# Patient Record
Sex: Male | Born: 1940 | Race: Black or African American | Hispanic: No | Marital: Married | Smoking: Former smoker
Health system: Southern US, Community
[De-identification: ages and names within clinical notes are randomized; demographics above are authoritative.]

## PROBLEM LIST (undated history)

## (undated) DIAGNOSIS — C9000 Multiple myeloma not having achieved remission: Secondary | ICD-10-CM

## (undated) DIAGNOSIS — K219 Gastro-esophageal reflux disease without esophagitis: Secondary | ICD-10-CM

## (undated) DIAGNOSIS — R0600 Dyspnea, unspecified: Secondary | ICD-10-CM

## (undated) DIAGNOSIS — E119 Type 2 diabetes mellitus without complications: Secondary | ICD-10-CM

## (undated) HISTORY — PX: EYE SURGERY: SHX253

## (undated) HISTORY — PX: CHOLECYSTECTOMY: SHX55

## (undated) HISTORY — PX: APPENDECTOMY: SHX54

## (undated) HISTORY — PX: HERNIA REPAIR: SHX51

---

## 2005-01-28 ENCOUNTER — Other Ambulatory Visit: Payer: Self-pay

## 2005-02-01 ENCOUNTER — Ambulatory Visit: Payer: Self-pay | Admitting: Specialist

## 2006-02-07 ENCOUNTER — Ambulatory Visit: Payer: Self-pay | Admitting: Gastroenterology

## 2018-05-26 DEATH — deceased

## 2019-07-10 ENCOUNTER — Inpatient Hospital Stay: Payer: Medicare Other | Admitting: Registered Nurse

## 2019-07-10 ENCOUNTER — Encounter
Admission: EM | Disposition: A | Discharge status: Home / Self Care | Payer: Self-pay | Source: Home / Self Care | Attending: Internal Medicine

## 2019-07-10 ENCOUNTER — Inpatient Hospital Stay: Payer: Medicare Other

## 2019-07-10 ENCOUNTER — Other Ambulatory Visit: Payer: Self-pay

## 2019-07-10 ENCOUNTER — Emergency Department: Payer: Medicare Other

## 2019-07-10 ENCOUNTER — Inpatient Hospital Stay
Admission: EM | Admit: 2019-07-10 | Discharge: 2019-07-12 | DRG: 116 | Disposition: A | Discharge status: Home / Self Care | Payer: Medicare Other | Attending: Internal Medicine | Admitting: Internal Medicine

## 2019-07-10 DIAGNOSIS — Z20828 Contact with and (suspected) exposure to other viral communicable diseases: Secondary | ICD-10-CM | POA: Diagnosis present

## 2019-07-10 DIAGNOSIS — E876 Hypokalemia: Secondary | ICD-10-CM | POA: Diagnosis present

## 2019-07-10 DIAGNOSIS — Z79899 Other long term (current) drug therapy: Secondary | ICD-10-CM | POA: Diagnosis not present

## 2019-07-10 DIAGNOSIS — Z7189 Other specified counseling: Secondary | ICD-10-CM | POA: Diagnosis not present

## 2019-07-10 DIAGNOSIS — S0522XA Ocular laceration and rupture with prolapse or loss of intraocular tissue, left eye, initial encounter: Principal | ICD-10-CM | POA: Diagnosis present

## 2019-07-10 DIAGNOSIS — Z9049 Acquired absence of other specified parts of digestive tract: Secondary | ICD-10-CM | POA: Diagnosis not present

## 2019-07-10 DIAGNOSIS — Z888 Allergy status to other drugs, medicaments and biological substances status: Secondary | ICD-10-CM

## 2019-07-10 DIAGNOSIS — R0602 Shortness of breath: Secondary | ICD-10-CM

## 2019-07-10 DIAGNOSIS — Z66 Do not resuscitate: Secondary | ICD-10-CM | POA: Diagnosis not present

## 2019-07-10 DIAGNOSIS — I739 Peripheral vascular disease, unspecified: Secondary | ICD-10-CM | POA: Diagnosis present

## 2019-07-10 DIAGNOSIS — H538 Other visual disturbances: Secondary | ICD-10-CM | POA: Diagnosis present

## 2019-07-10 DIAGNOSIS — K219 Gastro-esophageal reflux disease without esophagitis: Secondary | ICD-10-CM | POA: Diagnosis present

## 2019-07-10 DIAGNOSIS — I10 Essential (primary) hypertension: Secondary | ICD-10-CM | POA: Diagnosis present

## 2019-07-10 DIAGNOSIS — Z23 Encounter for immunization: Secondary | ICD-10-CM

## 2019-07-10 DIAGNOSIS — C9000 Multiple myeloma not having achieved remission: Secondary | ICD-10-CM | POA: Diagnosis present

## 2019-07-10 DIAGNOSIS — J9819 Other pulmonary collapse: Secondary | ICD-10-CM | POA: Diagnosis not present

## 2019-07-10 DIAGNOSIS — J90 Pleural effusion, not elsewhere classified: Secondary | ICD-10-CM | POA: Diagnosis not present

## 2019-07-10 DIAGNOSIS — Z515 Encounter for palliative care: Secondary | ICD-10-CM | POA: Diagnosis not present

## 2019-07-10 DIAGNOSIS — Z809 Family history of malignant neoplasm, unspecified: Secondary | ICD-10-CM

## 2019-07-10 DIAGNOSIS — Z91018 Allergy to other foods: Secondary | ICD-10-CM

## 2019-07-10 DIAGNOSIS — S0181XA Laceration without foreign body of other part of head, initial encounter: Secondary | ICD-10-CM | POA: Diagnosis present

## 2019-07-10 DIAGNOSIS — H4010X0 Unspecified open-angle glaucoma, stage unspecified: Secondary | ICD-10-CM | POA: Diagnosis present

## 2019-07-10 DIAGNOSIS — Z9889 Other specified postprocedural states: Secondary | ICD-10-CM

## 2019-07-10 DIAGNOSIS — S0590XA Unspecified injury of unspecified eye and orbit, initial encounter: Secondary | ICD-10-CM

## 2019-07-10 DIAGNOSIS — W06XXXA Fall from bed, initial encounter: Secondary | ICD-10-CM | POA: Diagnosis present

## 2019-07-10 DIAGNOSIS — Z87891 Personal history of nicotine dependence: Secondary | ICD-10-CM

## 2019-07-10 HISTORY — DX: Multiple myeloma not having achieved remission: C90.00

## 2019-07-10 HISTORY — PX: RUPTURED GLOBE EXPLORATION AND REPAIR: SHX2366

## 2019-07-10 LAB — BODY FLUID CELL COUNT WITH DIFFERENTIAL
Eos, Fluid: 2 %
Lymphs, Fluid: 93 %
Monocyte-Macrophage-Serous Fluid: 1 %
Neutrophil Count, Fluid: 4 %
Other Cells, Fluid: 0 %
Total Nucleated Cell Count, Fluid: 337 cu mm

## 2019-07-10 LAB — COMPREHENSIVE METABOLIC PANEL
ALT: 14 U/L (ref 0–44)
AST: 18 U/L (ref 15–41)
Albumin: 2.9 g/dL — ABNORMAL LOW (ref 3.5–5.0)
Alkaline Phosphatase: 98 U/L (ref 38–126)
Anion gap: 8 (ref 5–15)
BUN: 10 mg/dL (ref 8–23)
CO2: 26 mmol/L (ref 22–32)
Calcium: 8.5 mg/dL — ABNORMAL LOW (ref 8.9–10.3)
Chloride: 111 mmol/L (ref 98–111)
Creatinine, Ser: 0.8 mg/dL (ref 0.61–1.24)
GFR calc Af Amer: >60 mL/min (ref >60)
GFR calc non Af Amer: >60 mL/min (ref >60)
Glucose, Bld: 101 mg/dL — ABNORMAL HIGH (ref 70–99)
Potassium: 3.3 mmol/L — ABNORMAL LOW (ref 3.5–5.1)
Sodium: 145 mmol/L (ref 135–145)
Total Bilirubin: 2.5 mg/dL — ABNORMAL HIGH (ref 0.3–1.2)
Total Protein: 7.4 g/dL (ref 6.5–8.1)

## 2019-07-10 LAB — CBC
HCT: 31.5 % — ABNORMAL LOW (ref 39.0–52.0)
Hemoglobin: 9.7 g/dL — ABNORMAL LOW (ref 13.0–17.0)
MCH: 25.6 pg — ABNORMAL LOW (ref 26.0–34.0)
MCHC: 30.8 g/dL (ref 30.0–36.0)
MCV: 83.1 fL (ref 80.0–100.0)
Platelets: 215 10*3/uL (ref 150–400)
RBC: 3.79 MIL/uL — ABNORMAL LOW (ref 4.22–5.81)
RDW: 19.9 % — ABNORMAL HIGH (ref 11.5–15.5)
WBC: 4.6 10*3/uL (ref 4.0–10.5)
nRBC: 0 % (ref 0.0–0.2)

## 2019-07-10 LAB — PROTEIN, PLEURAL OR PERITONEAL FLUID: Total protein, fluid: <3 g/dL

## 2019-07-10 LAB — ALBUMIN, PLEURAL OR PERITONEAL FLUID: Albumin, Fluid: <1 g/dL

## 2019-07-10 LAB — LACTATE DEHYDROGENASE, PLEURAL OR PERITONEAL FLUID: LD, Fluid: 109 U/L — ABNORMAL HIGH (ref 3–23)

## 2019-07-10 LAB — BRAIN NATRIURETIC PEPTIDE: B Natriuretic Peptide: 600 pg/mL — ABNORMAL HIGH (ref 0.0–100.0)

## 2019-07-10 LAB — AMYLASE, PLEURAL OR PERITONEAL FLUID: Amylase, Fluid: 43 U/L

## 2019-07-10 LAB — SARS CORONAVIRUS 2 BY RT PCR (HOSPITAL ORDER, PERFORMED IN ~~LOC~~ HOSPITAL LAB): SARS Coronavirus 2: NEGATIVE

## 2019-07-10 LAB — GLUCOSE, PLEURAL OR PERITONEAL FLUID: Glucose, Fluid: 79 mg/dL

## 2019-07-10 LAB — TROPONIN I (HIGH SENSITIVITY)
Troponin I (High Sensitivity): 35 ng/L — ABNORMAL HIGH (ref <18)
Troponin I (High Sensitivity): 30 ng/L — ABNORMAL HIGH (ref <18)

## 2019-07-10 SURGERY — REPAIR, RUPTURE, GLOBE
Anesthesia: General | Site: Eye | Laterality: Left

## 2019-07-10 MED ORDER — TRAZODONE HCL 50 MG PO TABS: 25.0000 mg | ORAL_TABLET | Freq: Every evening | ORAL | Status: DC | PRN

## 2019-07-10 MED ORDER — MOXIFLOXACIN HCL 0.5 % OP SOLN
OPHTHALMIC | Status: DC | PRN
  Administered 2019-07-10: 0.4 mL via OPHTHALMIC

## 2019-07-10 MED ORDER — ROCURONIUM BROMIDE 100 MG/10ML IV SOLN
INTRAVENOUS | Status: DC | PRN
  Administered 2019-07-10: 40 mg via INTRAVENOUS

## 2019-07-10 MED ORDER — LIDOCAINE HCL (CARDIAC) PF 100 MG/5ML IV SOSY
PREFILLED_SYRINGE | INTRAVENOUS | Status: DC | PRN
  Administered 2019-07-10: 80 mg via INTRAVENOUS

## 2019-07-10 MED ORDER — SUGAMMADEX SODIUM 200 MG/2ML IV SOLN
INTRAVENOUS | Status: DC | PRN
  Administered 2019-07-10: 200 mg via INTRAVENOUS

## 2019-07-10 MED ORDER — PROPOFOL 10 MG/ML IV BOLUS
INTRAVENOUS | Status: DC | PRN
  Administered 2019-07-10: 100 mg via INTRAVENOUS

## 2019-07-10 MED ORDER — TETANUS-DIPHTH-ACELL PERTUSSIS 5-2.5-18.5 LF-MCG/0.5 IM SUSP
0.5000 mL | Freq: Once | INTRAMUSCULAR | Status: AC
  Administered 2019-07-12: 11:00:00 0.5 mL via INTRAMUSCULAR
  Filled 2019-07-10 (×2): qty 0.5

## 2019-07-10 MED ORDER — NA CHONDROIT SULF-NA HYALURON 40-30 MG/ML IO SOLN
INTRAOCULAR | Status: DC | PRN
  Administered 2019-07-10: 1 mL via INTRAOCULAR

## 2019-07-10 MED ORDER — SODIUM CHLORIDE 0.9 % IV SOLN
INTRAVENOUS | Status: DC
  Administered 2019-07-10 – 2019-07-11 (×4): via INTRAVENOUS

## 2019-07-10 MED ORDER — MOXIFLOXACIN HCL 0.5 % OP SOLN
1.0000 [drp] | Freq: Every day | OPHTHALMIC | Status: DC
  Administered 2019-07-10 – 2019-07-12 (×9): 1 [drp] via OPHTHALMIC
  Filled 2019-07-10: qty 3

## 2019-07-10 MED ORDER — FENTANYL CITRATE (PF) 100 MCG/2ML IJ SOLN
INTRAMUSCULAR | Status: DC | PRN
  Administered 2019-07-10: 50 ug via INTRAVENOUS

## 2019-07-10 MED ORDER — LIDOCAINE HCL (PF) 1 % IJ SOLN
INTRAMUSCULAR | Status: AC
  Administered 2019-07-10: 10:00:00
  Filled 2019-07-10: qty 10

## 2019-07-10 MED ORDER — ACETAMINOPHEN 325 MG PO TABS: 650.0000 mg | ORAL_TABLET | Freq: Four times a day (QID) | ORAL | Status: DC | PRN

## 2019-07-10 MED ORDER — NEOMYCIN-POLYMYXIN-DEXAMETH 3.5-10000-0.1 OP OINT
TOPICAL_OINTMENT | OPHTHALMIC | Status: AC
  Filled 2019-07-10: qty 3.5

## 2019-07-10 MED ORDER — ONDANSETRON HCL 4 MG/2ML IJ SOLN: 4.0000 mg | Freq: Once | INTRAMUSCULAR | Status: DC | PRN

## 2019-07-10 MED ORDER — DOCUSATE SODIUM 100 MG PO CAPS
100.0000 mg | ORAL_CAPSULE | Freq: Two times a day (BID) | ORAL | Status: DC
  Administered 2019-07-10 – 2019-07-12 (×4): 100 mg via ORAL
  Filled 2019-07-10 (×4): qty 1

## 2019-07-10 MED ORDER — DEXAMETHASONE SODIUM PHOSPHATE 4 MG/ML IJ SOLN
INTRAMUSCULAR | Status: AC
  Filled 2019-07-10: qty 1

## 2019-07-10 MED ORDER — SUGAMMADEX SODIUM 200 MG/2ML IV SOLN
INTRAVENOUS | Status: AC
  Filled 2019-07-10: qty 2

## 2019-07-10 MED ORDER — SODIUM CHLORIDE 0.9 % IV SOLN
INTRAVENOUS | Status: DC
  Administered 2019-07-10: 19:00:00 via INTRAVENOUS

## 2019-07-10 MED ORDER — ACETAMINOPHEN 650 MG RE SUPP
650.0000 mg | Freq: Four times a day (QID) | RECTAL | Status: DC | PRN
  Filled 2019-07-10: qty 1

## 2019-07-10 MED ORDER — PHENYLEPHRINE HCL (PRESSORS) 10 MG/ML IV SOLN
INTRAVENOUS | Status: DC | PRN
  Administered 2019-07-10 (×3): 100 ug via INTRAVENOUS

## 2019-07-10 MED ORDER — DEXAMETHASONE SODIUM PHOSPHATE 10 MG/ML IJ SOLN
INTRAMUSCULAR | Status: DC | PRN
  Administered 2019-07-10: 10 mg via INTRAVENOUS

## 2019-07-10 MED ORDER — TRIAMCINOLONE ACETONIDE 40 MG/ML IJ SUSP
INTRAMUSCULAR | Status: AC
  Filled 2019-07-10: qty 1

## 2019-07-10 MED ORDER — ONDANSETRON HCL 4 MG/2ML IJ SOLN
INTRAMUSCULAR | Status: DC | PRN
  Administered 2019-07-10: 4 mg via INTRAVENOUS

## 2019-07-10 MED ORDER — NA CHONDROIT SULF-NA HYALURON 40-17 MG/ML IO SOLN
INTRAOCULAR | Status: AC
  Filled 2019-07-10: qty 1

## 2019-07-10 MED ORDER — ONDANSETRON HCL 4 MG PO TABS
4.0000 mg | ORAL_TABLET | Freq: Four times a day (QID) | ORAL | Status: DC | PRN
  Filled 2019-07-10: qty 1

## 2019-07-10 MED ORDER — FENTANYL CITRATE (PF) 100 MCG/2ML IJ SOLN
INTRAMUSCULAR | Status: AC
  Filled 2019-07-10: qty 2

## 2019-07-10 MED ORDER — FENTANYL CITRATE (PF) 100 MCG/2ML IJ SOLN: 25.0000 ug | INTRAMUSCULAR | Status: DC | PRN

## 2019-07-10 MED ORDER — POVIDONE-IODINE 5 % OP SOLN
OPHTHALMIC | Status: DC | PRN
  Administered 2019-07-10: 1 via OPHTHALMIC

## 2019-07-10 MED ORDER — BISACODYL 5 MG PO TBEC: 5.0000 mg | DELAYED_RELEASE_TABLET | Freq: Every day | ORAL | Status: DC | PRN

## 2019-07-10 MED ORDER — DEXAMETHASONE SODIUM PHOSPHATE 10 MG/ML IJ SOLN
INTRAMUSCULAR | Status: AC
  Filled 2019-07-10: qty 1

## 2019-07-10 MED ORDER — HYDROCODONE-ACETAMINOPHEN 5-325 MG PO TABS: 1.0000 | ORAL_TABLET | ORAL | Status: DC | PRN

## 2019-07-10 MED ORDER — ONDANSETRON HCL 4 MG/2ML IJ SOLN
INTRAMUSCULAR | Status: AC
  Filled 2019-07-10: qty 2

## 2019-07-10 MED ORDER — CARBACHOL 0.01 % IO SOLN
INTRAOCULAR | Status: DC | PRN
  Administered 2019-07-10: 0.5 mL via INTRAOCULAR

## 2019-07-10 MED ORDER — ONDANSETRON HCL 4 MG/2ML IJ SOLN: 4.0000 mg | Freq: Four times a day (QID) | INTRAMUSCULAR | Status: DC | PRN

## 2019-07-10 SURGICAL SUPPLY — 18 items
BNDG EYE OVAL (GAUZE/BANDAGES/DRESSINGS) ×3 IMPLANT
CORD BIP STRL DISP 12FT (MISCELLANEOUS) IMPLANT
ERASER HMR WETFIELD 25G (MISCELLANEOUS) IMPLANT
GLOVE BIO SURGEON STRL SZ8 (GLOVE) ×3 IMPLANT
GLOVE SURG LX 6.5 MICRO (GLOVE) ×2
GLOVE SURG LX 8.0 MICRO (GLOVE) ×2
GLOVE SURG LX STRL 6.5 MICRO (GLOVE) ×1 IMPLANT
GLOVE SURG LX STRL 8.0 MICRO (GLOVE) ×1 IMPLANT
GOWN STRL REUS W/ TWL LRG LVL3 (GOWN DISPOSABLE) ×2 IMPLANT
GOWN STRL REUS W/TWL LRG LVL3 (GOWN DISPOSABLE) ×4
NDL RETROBULBAR 25GX1.5 STRL (NEEDLE) ×3 IMPLANT
PACK CATARACT (MISCELLANEOUS) ×3 IMPLANT
SOL PREP PVP 2OZ (MISCELLANEOUS) ×3
SOLUTION PREP PVP 2OZ (MISCELLANEOUS) ×1 IMPLANT
STRAP SAFETY 5IN WIDE (MISCELLANEOUS) ×3 IMPLANT
SUT ETHILON 10-0 CS-B-6CS-B-6 (SUTURE) ×3
SUTURE EHLN 10-0 CS-B-6CS-B-6 (SUTURE) ×1 IMPLANT
SYR 10ML LL (SYRINGE) ×3 IMPLANT

## 2019-07-10 NOTE — ED Notes (Signed)
MD at bedside to update patient

## 2019-07-10 NOTE — Op Note (Signed)
OPERATIVE NOTE  Dan Miranda [id number] 07/10/2019   PREOPERATIVE DIAGNOSIS:  open globe (corneal laceration), left eye   POSTOPERATIVE DIAGNOSIS:    same.     PROCEDURE:  Repair of corneal laceration with repositing of uveal tissue, left eye cpt 15726     SURGEON:  Benay Pillow, MD, MPH   ANESTHESIA:  general   ESTIMATED BLOOD LOSS: <1 mL   COMPLICATIONS:  None.   DESCRIPTION OF PROCEDURE:  The patient was identified in the holding room and transported to the operating room and placed in the supine position under the operating microscope.  The left eye was identified as the operative eye and it was prepped and draped in the usual sterile ophthalmic fashion.  A 2.5 mm corneal wound was noted near the limbus consistent with a prior cataract surgical wound.  Iris tissue was prolapsed from the wound.  The intraocular lens was well centered and in good position without evidence of a vitreous loss or violation of the posterior segment.   1.0 millimeter clear-corneal paracenteses were made at the 6:00 and 1:00 positions.  A small amount of miostat was injected, followed by viscoat.  The OVD cannula was used to sweet the iris back into the eye.  A small amount adhered to the exterior of the globe and this was cut with scissors.   The iris began to bleed, the blood was rinsed out with BSS.  A small amount of kenalog was also injected to tag any possible vitreous, none was found.    A 10-0 nylon was used to bisect the wound and the knot was buried.  The wound was sealed, there was no wound leak or other wound found.  There was a small conjunctival laceration noted just posterior to the corneal wound temporally but no laceration to the sclera.    0.1 cc of vigamox was injected into the anterior chamber.   There was minimal intracameral heme at this point and the eye pressure felt normal by palpation. The patient's face was cleaned with wet and dry gauze.   Maxitrol ointment was placed  on the eye, followed by an eye patch and shield.  The patient was extubated and taken to the recovery room in stable condition without complications of anesthesia or surgery.  Benay Pillow 07/10/2019, 7:58 PM

## 2019-07-10 NOTE — Anesthesia Procedure Notes (Signed)
Procedure Name: Intubation Date/Time: 07/10/2019 7:10 PM Performed by: Jonna Clark, CRNA Pre-anesthesia Checklist: Patient identified, Patient being monitored, Timeout performed, Emergency Drugs available and Suction available Patient Re-evaluated:Patient Re-evaluated prior to induction Oxygen Delivery Method: Circle system utilized Preoxygenation: Pre-oxygenation with 100% oxygen Induction Type: IV induction Ventilation: Mask ventilation without difficulty Laryngoscope Size: Mac and 4 Grade View: Grade III Tube type: Oral Tube size: 7.5 mm Number of attempts: 1 Airway Equipment and Method: Stylet Placement Confirmation: ETT inserted through vocal cords under direct vision,  positive ETCO2 and breath sounds checked- equal and bilateral Secured at: 21 cm Tube secured with: Tape Dental Injury: Teeth and Oropharynx as per pre-operative assessment  Difficulty Due To: Difficulty was anticipated and Difficult Airway- due to anterior larynx Comments: Suggest use of Mcgrath

## 2019-07-10 NOTE — Plan of Care (Signed)
Admitted as a result of a fall - L eye injury/prolapse of iris tissue requiring surgical repair.  Also found to have near complete atelectasis of RLL - not a consequence of the fall.  Had U/S guided thoracentesis. Seems to be a chronic problem

## 2019-07-10 NOTE — ED Notes (Signed)
Admitting MD at bedside.

## 2019-07-10 NOTE — ED Provider Notes (Signed)
[facility] Emergency Department Provider Note  ____________________________________________   None    (approximate)  I have reviewed the triage vital signs and the nursing notes.   HISTORY  Chief Complaint Laceration    HPI Dan Shader. is a 78 y.o. male with multiple myeloma who presents for fall.  Patient came from home after falling at his forehead and has laceration.  It was mechanical nature in which he rolled out of bed.  Denies any LOC.  Patient is not on any anticoagulation.  Patient is having decreased vision in his left eye.  Patient is also having pain on his forehead from a laceration that is moderate, constant, worse with pushing on it, better rest.  This occurred just prior to patient's arrival.  Review of systems patient also endorses shortness of breath has been going on for the past 3 weeks.  Denies any chest pain.     Past Medical History:  Diagnosis Date   Multiple myeloma ([place])     There are no active problems to display for this patient.   Past Surgical History:  Procedure Laterality Date   APPENDECTOMY     CHOLECYSTECTOMY     HERNIA REPAIR      Prior to Admission medications   Not on File    Allergies Patient has no known allergies.  No family history on file.  Social History Social History   Tobacco Use   Smoking status: Former Smoker   Smokeless tobacco: Never Used  Substance Use Topics   Alcohol use: Not on file   Drug use: Not on file      Review of Systems Constitutional: No fever/chills Eyes: Left eye vision changes ENT: No sore throat. Cardiovascular: Denies chest pain. Respiratory: Positive shortness of breath Gastrointestinal: No abdominal pain.  No nausea, no vomiting.  No diarrhea.  No constipation. Genitourinary: Negative for dysuria. Musculoskeletal: Facial trauma. Skin: Negative for rash. Neurological: Negative for headaches, focal weakness or numbness. All other ROS  negative ____________________________________________   PHYSICAL EXAM:  VITAL SIGNS: ED Triage Vitals  Enc Vitals Group     BP 07/10/19 0322 (!) 152/82     Pulse Rate 07/10/19 0322 74     Resp 07/10/19 0322 18     Temp 07/10/19 0322 98.1 F (36.7 C)     Temp Source 07/10/19 0322 Oral     SpO2 07/10/19 0322 94 %     Weight 07/10/19 0318 219 lb (99.3 kg)     Height 07/10/19 0318 6' (1.829 m)     Head Circumference --      Peak Flow --      Pain Score 07/10/19 0317 3     Pain Loc --      Pain Edu? --      Excl. in Industry? --     Constitutional: Alert and oriented. Well appearing and in no acute distress. Eyes: Conjunctivae hemorrhage in the left eye, some swelling around the eye.  EOMI.  Head:  laceration on the forehead about 6 cm Nose: No congestion/rhinnorhea.  No septal hematoma Mouth/Throat: Mucous membranes are moist.   Neck: No stridor. Trachea Midline. FROM no C-spine tenderness Cardiovascular: Normal rate, regular rhythm. Grossly normal heart sounds.  Good peripheral circulation. Respiratory: Normal respiratory effort.  No retractions.Auditory airway wheezing Gastrointestinal: Soft and nontender. No distention. No abdominal bruits.  Musculoskeletal: 2+ edema bilaterally.  No joint effusions. Neurologic:  Normal speech and language. No gross focal neurologic deficits are appreciated.  Skin:  Skin is warm, dry and intact. No rash noted. Psychiatric: Mood and affect are normal. Speech and behavior are normal. GU: Deferred  Back: No back tenderness ____________________________________________   LABS (all labs ordered are listed, but only abnormal results are displayed)  Labs Reviewed  COMPREHENSIVE METABOLIC PANEL - Abnormal; Notable for the following components:      Result Value   Potassium 3.3 (*)    Glucose, Bld 101 (*)    Calcium 8.5 (*)    Albumin 2.9 (*)    Total Bilirubin 2.5 (*)    All other components within normal limits  CBC - Abnormal; Notable for the  following components:   RBC 3.79 (*)    Hemoglobin 9.7 (*)    HCT 31.5 (*)    MCH 25.6 (*)    RDW 19.9 (*)    All other components within normal limits  BRAIN NATRIURETIC PEPTIDE - Abnormal; Notable for the following components:   B Natriuretic Peptide 600.0 (*)    All other components within normal limits  TROPONIN I (HIGH SENSITIVITY) - Abnormal; Notable for the following components:   Troponin I (High Sensitivity) 35 (*)    All other components within normal limits  TROPONIN I (HIGH SENSITIVITY) - Abnormal; Notable for the following components:   Troponin I (High Sensitivity) 30 (*)    All other components within normal limits  SARS CORONAVIRUS 2 (HOSPITAL ORDER, [facility])  GRAM STAIN  BODY FLUID CULTURE  FUNGUS CULTURE WITH STAIN  ACID FAST CULTURE WITH REFLEXED SENSITIVITIES  ACID FAST SMEAR (AFB)  URINALYSIS, COMPLETE (UACMP) WITH MICROSCOPIC  LACTATE DEHYDROGENASE, PLEURAL OR PERITONEAL FLUID  TRIGLYCERIDES, BODY FLUIDS  BODY FLUID CELL COUNT WITH DIFFERENTIAL  CHOLESTEROL, BODY FLUID  AMYLASE, PLEURAL OR PERITONEAL FLUID   ALBUMIN, PLEURAL OR PERITONEAL FLUID  PROTEIN, PLEURAL OR PERITONEAL FLUID  GLUCOSE, PLEURAL OR PERITONEAL FLUID  COMP PANEL: LEUKEMIA/LYMPHOMA  PH, BODY FLUID  CYTOLOGY - NON PAP   ____________________________________________   ED ECG REPORT I, Vanessa [place], the attending physician, personally viewed and interpreted this ECG.  Sinus rate of 96, no ST elevation, no T wave inversion, normal intervals ____________________________________________  RADIOLOGY Tino Bellow, personally viewed and evaluated these images (plain radiographs) as part of my medical decision making, as well as reviewing the written report by the radiologist.  ED MD interpretation: X-ray with effusion  Official radiology report(s): Ct Head Wo Contrast  Result Date: 07/10/2019 CLINICAL DATA:  Fall from bed with forehead  lacerations, initial encounter EXAM: CT HEAD WITHOUT CONTRAST CT MAXILLOFACIAL WITHOUT CONTRAST CT CERVICAL SPINE WITHOUT CONTRAST TECHNIQUE: Multidetector CT imaging of the head, cervical spine, and maxillofacial structures were performed using the standard protocol without intravenous contrast. Multiplanar CT image reconstructions of the cervical spine and maxillofacial structures were also generated. COMPARISON:  None. FINDINGS: CT HEAD FINDINGS Brain: Mild atrophic changes are noted. No findings to suggest acute hemorrhage, acute infarction or space-occupying mass lesion are noted. Vascular: No hyperdense vessel or unexpected calcification. Skull: Normal. Negative for fracture or focal lesion. Other: None. CT MAXILLOFACIAL FINDINGS Osseous: No acute bony abnormality is noted. Orbits: Orbits and their contents are within normal limits. Sinuses: Paranasal sinuses are well aerated bilaterally. Soft tissues: There is a cystic appearing lesion deep to the right parotid gland in the parapharyngeal space on the right. It measures 4 by 1.4 cm in greatest transverse and AP dimensions. Few tiny calcifications are noted within. A fat plane is  noted between it and the deep aspect of the right parotid gland although this may still represent a salivary gland tumor. Other considerations such as nerve sheath tumor or lymphangioma would deserve consideration as well. No other soft tissue abnormality is noted. CT CERVICAL SPINE FINDINGS Alignment: Within normal limits. Skull base and vertebrae: 7 cervical segments are well visualized. Vertebral body height is well maintained. Disc space narrowing is noted from C3 to C7. Facet hypertrophic changes are noted. No acute fracture or acute facet abnormality is noted. The odontoid is within normal limits. Soft tissues and spinal canal: Surrounding soft tissue structures again demonstrate the previously described ovoid cystic lesion deep to the right parotid gland. No other focal  abnormality is noted. Upper chest: Visualized lung apices show evidence of a left-sided pleural effusion but incompletely evaluated on this exam. Other: None IMPRESSION: CT of the head: Mild atrophic changes without acute intracranial abnormality. CT of the maxillofacial bones: No acute bony abnormality is noted. Ovoid cystic lesion deep to the right parotid gland in the parapharyngeal space as described. Nonemergent MRI may be helpful for further evaluation. CT of the cervical spine: Multilevel degenerative change without acute bony abnormality. Left-sided pleural effusion seen on the last image and incompletely evaluated on this exam. Electronically Signed   By: Inez Catalina M.D.   On: 07/10/2019 07:53   Ct Chest Wo Contrast  Result Date: 07/10/2019 CLINICAL DATA:  Pt arrives to ED via [facility] from home with c/o forehead and left eye lacerations s/p rolling out of bed PTA. Pt denies any LOC; pt reports some neck pain but denies upper or lower back pain. Pt denies use of anticoagulants EXAM: CT CHEST WITHOUT CONTRAST TECHNIQUE: Multidetector CT imaging of the chest was performed following the standard protocol without IV contrast. COMPARISON:  Current chest radiograph. FINDINGS: Cardiovascular: Heart is top-normal in size. There is a small pericardial effusion. Dense three-vessel coronary artery calcifications. Mild enlargement of main pulmonary artery to 3.8 cm. Normal caliber abdominal aorta. Aortic and arch vessel atherosclerotic calcifications. Mediastinum/Nodes: No neck base or axillary masses or pathologically enlarged lymph nodes. Thyroid is normal in size. There are scattered prominent mediastinal lymph nodes, largest a precarinal node measuring 1.5 cm in short axis. No mediastinal masses. No hilar masses or defined enlarged lymph nodes. Lungs/Pleura: Large right pleural effusion and small left pleural effusion. There are pleural based calcified plaques most evident along the hemidiaphragm surfaces. The  right lower lobe is mostly collapsed. There is partial collapse of the right middle lobe. Irregular linear opacities are noted in the right upper lobe, consistent with atelectasis or scarring, along with mild interstitial thickening. Minimal atelectasis in the dependent left lower lobe. There is irregular opacity in the left upper lobe that may reflect atelectasis or scarring. No lung mass or suspicious nodule. There is a questionable partial filling defect within lower lobe segmental bronchi centrally. No pneumothorax. Upper Abdomen: No acute findings. Small low-density lesion at the dome of the liver consistent with a cyst. Musculoskeletal: No fracture or acute finding. Degenerative changes noted throughout the visualized spine as well as bridging anterior osteophytes suggesting DISH. No osteoblastic or osteolytic lesions. IMPRESSION: 1. Large right and small left pleural effusions. 2. Near complete atelectasis of the right lower lobe partial atelectasis of the right upper lobe. Questionable partial filling defect and a central lower lobe bronchus. Consider follow-up bronchoscopy. 3. Other areas of lung opacity are consistent with scarring or additional atelectasis. No convincing pneumonia. 4. 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 5. Small pericardial effusion. Dense three-vessel coronary artery calcifications. 6. Aortic atherosclerosis Aortic Atherosclerosis (ICD10-I70.0). Electronically Signed   By: Lajean Manes M.D.   On: 07/10/2019 13:18   Ct Cervical Spine Wo Contrast  Result Date: 07/10/2019 CLINICAL DATA:  Fall from bed with forehead lacerations, initial encounter EXAM: CT HEAD WITHOUT CONTRAST CT MAXILLOFACIAL WITHOUT CONTRAST CT CERVICAL SPINE WITHOUT CONTRAST TECHNIQUE: Multidetector CT imaging of the head, cervical spine, and maxillofacial structures were performed using the standard protocol without intravenous contrast. Multiplanar CT image  reconstructions of the cervical spine and maxillofacial structures were also generated. COMPARISON:  None. FINDINGS: CT HEAD FINDINGS Brain: Mild atrophic changes are noted. No findings to suggest acute hemorrhage, acute infarction or space-occupying mass lesion are noted. Vascular: No hyperdense vessel or unexpected calcification. Skull: Normal. Negative for fracture or focal lesion. Other: None. CT MAXILLOFACIAL FINDINGS Osseous: No acute bony abnormality is noted. Orbits: Orbits and their contents are within normal limits. Sinuses: Paranasal sinuses are well aerated bilaterally. Soft tissues: There is a cystic appearing lesion deep to the right parotid gland in the parapharyngeal space on the right. It measures 4 by 1.4 cm in greatest transverse and AP dimensions. Few tiny calcifications are noted within. A fat plane is noted between it and the deep aspect of the right parotid gland although this may still represent a salivary gland tumor. Other considerations such as nerve sheath tumor or lymphangioma would deserve consideration as well. No other soft tissue abnormality is noted. CT CERVICAL SPINE FINDINGS Alignment: Within normal limits. Skull base and vertebrae: 7 cervical segments are well visualized. Vertebral body height is well maintained. Disc space narrowing is noted from C3 to C7. Facet hypertrophic changes are noted. No acute fracture or acute facet abnormality is noted. The odontoid is within normal limits. Soft tissues and spinal canal: Surrounding soft tissue structures again demonstrate the previously described ovoid cystic lesion deep to the right parotid gland. No other focal abnormality is noted. Upper chest: Visualized lung apices show evidence of a left-sided pleural effusion but incompletely evaluated on this exam. Other: None IMPRESSION: CT of the head: Mild atrophic changes without acute intracranial abnormality. CT of the maxillofacial bones: No acute bony abnormality is noted. Ovoid  cystic lesion deep to the right parotid gland in the parapharyngeal space as described. Nonemergent MRI may be helpful for further evaluation. CT of the cervical spine: Multilevel degenerative change without acute bony abnormality. Left-sided pleural effusion seen on the last image and incompletely evaluated on this exam. Electronically Signed   By: Inez Catalina M.D.   On: 07/10/2019 07:53   Dg Chest Port 1 View  Result Date: 07/10/2019 CLINICAL DATA:  Fall out of bed EXAM: PORTABLE CHEST 1 VIEW COMPARISON:  None. FINDINGS: Moderate to large right pleural effusion with associated atelectasis or consolidation. No obvious displaced rib fracture or pneumothorax. There is diffuse bilateral heterogeneous and interstitial pulmonary opacity. Mild cardiomegaly. IMPRESSION: Moderate to large right pleural effusion with associated atelectasis or consolidation. No obvious displaced rib fracture or pneumothorax. There is diffuse bilateral heterogeneous and interstitial pulmonary opacity. Mild cardiomegaly. Findings are concerning for multifocal infection with large associated effusion although malignancy is a differential consideration. Consider CT to further evaluate. Electronically Signed   By: Eddie Candle M.D.   On: 07/10/2019 08:26   Ct Maxillofacial Wo Contrast  Result Date: 07/10/2019 CLINICAL DATA:  Fall from bed with forehead lacerations, initial encounter EXAM: CT HEAD WITHOUT CONTRAST CT MAXILLOFACIAL WITHOUT  CONTRAST CT CERVICAL SPINE WITHOUT CONTRAST TECHNIQUE: Multidetector CT imaging of the head, cervical spine, and maxillofacial structures were performed using the standard protocol without intravenous contrast. Multiplanar CT image reconstructions of the cervical spine and maxillofacial structures were also generated. COMPARISON:  None. FINDINGS: CT HEAD FINDINGS Brain: Mild atrophic changes are noted. No findings to suggest acute hemorrhage, acute infarction or space-occupying mass lesion are noted.  Vascular: No hyperdense vessel or unexpected calcification. Skull: Normal. Negative for fracture or focal lesion. Other: None. CT MAXILLOFACIAL FINDINGS Osseous: No acute bony abnormality is noted. Orbits: Orbits and their contents are within normal limits. Sinuses: Paranasal sinuses are well aerated bilaterally. Soft tissues: There is a cystic appearing lesion deep to the right parotid gland in the parapharyngeal space on the right. It measures 4 by 1.4 cm in greatest transverse and AP dimensions. Few tiny calcifications are noted within. A fat plane is noted between it and the deep aspect of the right parotid gland although this may still represent a salivary gland tumor. Other considerations such as nerve sheath tumor or lymphangioma would deserve consideration as well. No other soft tissue abnormality is noted. CT CERVICAL SPINE FINDINGS Alignment: Within normal limits. Skull base and vertebrae: 7 cervical segments are well visualized. Vertebral body height is well maintained. Disc space narrowing is noted from C3 to C7. Facet hypertrophic changes are noted. No acute fracture or acute facet abnormality is noted. The odontoid is within normal limits. Soft tissues and spinal canal: Surrounding soft tissue structures again demonstrate the previously described ovoid cystic lesion deep to the right parotid gland. No other focal abnormality is noted. Upper chest: Visualized lung apices show evidence of a left-sided pleural effusion but incompletely evaluated on this exam. Other: None IMPRESSION: CT of the head: Mild atrophic changes without acute intracranial abnormality. CT of the maxillofacial bones: No acute bony abnormality is noted. Ovoid cystic lesion deep to the right parotid gland in the parapharyngeal space as described. Nonemergent MRI may be helpful for further evaluation. CT of the cervical spine: Multilevel degenerative change without acute bony abnormality. Left-sided pleural effusion seen on the last  image and incompletely evaluated on this exam. Electronically Signed   By: Inez Catalina M.D.   On: 07/10/2019 07:53    ____________________________________________   PROCEDURES  Procedure(s) performed (including Critical Care):  Marland KitchenMarland KitchenLaceration Repair  Date/Time: 07/10/2019 4:43 PM Performed by: Vanessa [place], MD Authorized by: Vanessa [place], MD   Consent:    Consent obtained:  Verbal   Consent given by:  Patient   Risks discussed:  Infection, pain, retained foreign body, tendon damage, poor cosmetic result and need for additional repair   Alternatives discussed:  No treatment Anesthesia (see MAR for exact dosages):    Anesthesia method:  Local infiltration   Local anesthetic:  Lidocaine 1% w/o epi Laceration details:    Location:  Face   Face location:  Forehead   Length (cm):  6   Depth (mm):  3 Repair type:    Repair type:  Simple Pre-procedure details:    Preparation:  Patient was prepped and draped in usual sterile fashion Exploration:    Wound exploration: entire depth of wound probed and visualized     Wound extent: no foreign bodies/material noted     Contaminated: no   Treatment:    Area cleansed with:  Saline   Amount of cleaning:  Standard   Irrigation solution:  Sterile saline   Visualized foreign bodies/material removed: no  Skin repair:    Repair method:  Sutures   Suture size:  6-0   Suture material:  Prolene   Number of sutures:  5 Approximation:    Approximation:  Close Post-procedure details:    Dressing:  Open (no dressing)   Patient tolerance of procedure:  Tolerated well, no immediate complications     ____________________________________________   INITIAL IMPRESSION / ASSESSMENT AND PLAN / ED COURSE  Dan Cundari. was evaluated in Emergency Department on 07/10/2019 for the symptoms described in the history of present illness. He was evaluated in the context of the global COVID-19 pandemic, which necessitated consideration that the  patient might be at risk for infection with the SARS-CoV-2 virus that causes COVID-19. Institutional protocols and algorithms that pertain to the evaluation of patients at risk for COVID-19 are in a state of rapid change based on information released by regulatory bodies including the CDC and federal and state organizations. These policies and algorithms were followed during the patient's care in the ED.     Patient had a mechanical fall out of bed.  CT is ordered to rule out epidural, subdural hematoma, cervical fracture, facial fracture.  Patient will need laceration repair of his forehead.   Patient also endorses new shortness of breath and significant edema on exam.  Patient said the edema has been there for a long time however the shortness of breath is new with past 3 weeks.  Patient is satting 89% on room air during my evaluation.  Will get labs to evaluate for patient's shortness of breath as well as chest x-ray.     CT scans are negative for fractures or bleed.  Chest x-ray is positive for large right pleural effusion and concern for bilateral interstitial pulmonary opacities.  Given patient is afebrile will hold off on antibiotics.  Lac repair completed.  Patient continues to have blurry vision and pupil defect noted.  CT scans were negative.  Consult to ophthalmology place.  Discussed with Dr. Edison Pace who can see patient.  Given patient's x-ray with the effusion and shortness of breath occasionally satting down to 88-89% at rest patient will need to be admitted for thoracentesis and be followed along with all neurology.  Admit to the hospital team.    ____________________________________________   FINAL CLINICAL IMPRESSION(S) / ED DIAGNOSES   Final diagnoses:  Shortness of breath  Status post thoracentesis      MEDICATIONS GIVEN DURING THIS VISIT:  Medications  lidocaine (PF) (XYLOCAINE) 1 % injection (has no administration in time range)  0.9 %  sodium chloride infusion (  Intravenous New Bag/Given 07/10/19 1528)  acetaminophen (TYLENOL) tablet 650 mg (has no administration in time range)    Or  acetaminophen (TYLENOL) suppository 650 mg (has no administration in time range)  HYDROcodone-acetaminophen (NORCO/VICODIN) 5-325 MG per tablet 1-2 tablet (has no administration in time range)  traZODone (DESYREL) tablet 25 mg (has no administration in time range)  docusate sodium (COLACE) capsule 100 mg (has no administration in time range)  bisacodyl (DULCOLAX) EC tablet 5 mg (has no administration in time range)  ondansetron (ZOFRAN) tablet 4 mg (has no administration in time range)    Or  ondansetron (ZOFRAN) injection 4 mg (has no administration in time range)  Tdap (BOOSTRIX) injection 0.5 mL (has no administration in time range)  moxifloxacin (VIGAMOX) 0.5 % ophthalmic solution 1 drop (1 drop Left Eye Given 07/10/19 1549)     ED Discharge Orders    None  Note:  This document was prepared using Dragon voice recognition software and may include unintentional dictation errors.   Vanessa [place], MD 07/10/19 [phone]

## 2019-07-10 NOTE — Consult Note (Signed)
Reason for Consult:eyelid swelling, proptosis in setting of fall. Referring Physician: Jari Pigg, [place] [facility]  Chief complaint: blurry vision in the left eye.  HPI: Dan Miranda. is an 78 y.o. male who fell this morning in the early hours when he woke up to go to the bathroom.  He reports blurry vision in the left eye and eye pain.  He was brought to the [facility] ED and CT maxillofacial showed no orbital fracture but the ED MD noticed an irregular pupil so called an ophthalmology consult.  He was also found to have a pleural effusion and will need a thoracentesis.  The patient reports he usually wears glasses but does not have them with him currently.  His normal eye doctor is at the [facility].  He reports using timolol and brimonidine eye drops for glaucoma. Past ocular history significant for glaucoma, history of uncomplicated cataract surgery 2-3 years ago in both eyes, as well as laser eye surgery (SLT?).       Marland Kitchen  Past Medical History:  Diagnosis Date  . Multiple myeloma ([place])     ROS  Past Surgical History:  Procedure Laterality Date  . APPENDECTOMY    . CHOLECYSTECTOMY    . HERNIA REPAIR    Cataract surgery both eyes.  No family history on file.  Social History:  reports that he has quit smoking. He has never used smokeless tobacco. No history on file for alcohol and drug.  Allergies: No Known Allergies  Prior to Admission medications   Not on File    Results for orders placed or performed during the hospital encounter of 07/10/19 (from the past 48 hour(s))  Comprehensive metabolic panel     Status: Abnormal   Collection Time: 07/10/19  8:35 AM  Result Value Ref Range   Sodium 145 135 - 145 mmol/L   Potassium 3.3 (L) 3.5 - 5.1 mmol/L   Chloride 111 98 - 111 mmol/L   CO2 26 22 - 32 mmol/L   Glucose, Bld 101 (H) 70 - 99 mg/dL   BUN 10 8 - 23 mg/dL   Creatinine, Ser 0.80 0.61 - 1.24 mg/dL   Calcium 8.5 (L) 8.9 - 10.3 mg/dL   Total Protein 7.4 6.5 - 8.1 g/dL    Albumin 2.9 (L) 3.5 - 5.0 g/dL   AST 18 15 - 41 U/L   ALT 14 0 - 44 U/L   Alkaline Phosphatase 98 38 - 126 U/L   Total Bilirubin 2.5 (H) 0.3 - 1.2 mg/dL   GFR calc non Af Amer >60 >60 mL/min   GFR calc Af Amer >60 >60 mL/min   Anion gap 8 5 - 15    Comment: Performed at [facility], [address] [place], [place] [address]  CBC     Status: Abnormal   Collection Time: 07/10/19  8:35 AM  Result Value Ref Range   WBC 4.6 4.0 - 10.5 K/uL   RBC 3.79 (L) 4.22 - 5.81 MIL/uL   Hemoglobin 9.7 (L) 13.0 - 17.0 g/dL   HCT 31.5 (L) 39.0 - 52.0 %   MCV 83.1 80.0 - 100.0 fL   MCH 25.6 (L) 26.0 - 34.0 pg   MCHC 30.8 30.0 - 36.0 g/dL   RDW 19.9 (H) 11.5 - 15.5 %   Platelets 215 150 - 400 K/uL   nRBC 0.0 0.0 - 0.2 %    Comment: Performed at [facility], [address], [place], [place] [address]  Brain  natriuretic peptide     Status: Abnormal   Collection Time: 07/10/19  8:35 AM  Result Value Ref Range   B Natriuretic Peptide 600.0 (H) 0.0 - 100.0 pg/mL    Comment: Performed at [facility], [place], [place] [address]  Troponin I (High Sensitivity)     Status: Abnormal   Collection Time: 07/10/19  8:35 AM  Result Value Ref Range   Troponin I (High Sensitivity) 35 (H) <18 ng/L    Comment: (NOTE) Elevated high sensitivity troponin I (hsTnI) values and significant  changes across serial measurements may suggest ACS but many other  chronic and acute conditions are known to elevate hsTnI results.  Refer to the "Links" section for chest pain algorithms and additional  guidance. Performed at [facility], [address], [place], [place] [address]   SARS Coronavirus 2 (CEPHEID - Performed in [facility] hospital lab), Hosp Order     Status: None   Collection Time: 07/10/19  8:35 AM   Specimen: Nasopharyngeal Swab  Result Value Ref Range   SARS Coronavirus 2 NEGATIVE NEGATIVE    Comment: (NOTE) If result is NEGATIVE SARS-CoV-2  target nucleic acids are NOT DETECTED. The SARS-CoV-2 RNA is generally detectable in upper and lower  respiratory specimens during the acute phase of infection. The lowest  concentration of SARS-CoV-2 viral copies this assay can detect is 250  copies / mL. A negative result does not preclude SARS-CoV-2 infection  and should not be used as the sole basis for treatment or other  patient management decisions.  A negative result may occur with  improper specimen collection / handling, submission of specimen other  than nasopharyngeal swab, presence of viral mutation(s) within the  areas targeted by this assay, and inadequate number of viral copies  (<250 copies / mL). A negative result must be combined with clinical  observations, patient history, and epidemiological information. If result is POSITIVE SARS-CoV-2 target nucleic acids are DETECTED. The SARS-CoV-2 RNA is generally detectable in upper and lower  respiratory specimens dur ing the acute phase of infection.  Positive  results are indicative of active infection with SARS-CoV-2.  Clinical  correlation with patient history and other diagnostic information is  necessary to determine patient infection status.  Positive results do  not rule out bacterial infection or co-infection with other viruses. If result is PRESUMPTIVE POSTIVE SARS-CoV-2 nucleic acids MAY BE PRESENT.   A presumptive positive result was obtained on the submitted specimen  and confirmed on repeat testing.  While 2019 novel coronavirus  (SARS-CoV-2) nucleic acids may be present in the submitted sample  additional confirmatory testing may be necessary for epidemiological  and / or clinical management purposes  to differentiate between  SARS-CoV-2 and other Sarbecovirus currently known to infect humans.  If clinically indicated additional testing with an alternate test  methodology [phone]) is advised. The SARS-CoV-2 RNA is generally  detectable in upper and lower  respiratory sp ecimens during the acute  phase of infection. The expected result is Negative. Fact Sheet for Patients:  StrictlyIdeas.no Fact Sheet for Healthcare Providers: BankingDealers.co.za This test is not yet approved or cleared by the [place] FDA and has been authorized for detection and/or diagnosis of SARS-CoV-2 by FDA under an Emergency Use Authorization (EUA).  This EUA will remain in effect (meaning this test can be used) for the duration of the COVID-19 declaration under Section 564(b)(1) of the Act, 21 U.S.C. section 360bbb-3(b)(1), unless the authorization is terminated or  revoked sooner. Performed at [facility], [place], [place] [address]   Troponin I (High Sensitivity)     Status: Abnormal   Collection Time: 07/10/19  9:48 AM  Result Value Ref Range   Troponin I (High Sensitivity) 30 (H) <18 ng/L    Comment: (NOTE) Elevated high sensitivity troponin I (hsTnI) values and significant  changes across serial measurements may suggest ACS but many other  chronic and acute conditions are known to elevate hsTnI results.  Refer to the "Links" section for chest pain algorithms and additional  guidance. Performed at [facility], [place],  [address]     Ct Head Wo Contrast  Result Date: 07/10/2019 CLINICAL DATA:  Fall from bed with forehead lacerations, initial encounter EXAM: CT HEAD WITHOUT CONTRAST CT MAXILLOFACIAL WITHOUT CONTRAST CT CERVICAL SPINE WITHOUT CONTRAST TECHNIQUE: Multidetector CT imaging of the head, cervical spine, and maxillofacial structures were performed using the standard protocol without intravenous contrast. Multiplanar CT image reconstructions of the cervical spine and maxillofacial structures were also generated. COMPARISON:  None. FINDINGS: CT HEAD FINDINGS Brain: Mild atrophic changes are noted. No findings to suggest acute hemorrhage,  acute infarction or space-occupying mass lesion are noted. Vascular: No hyperdense vessel or unexpected calcification. Skull: Normal. Negative for fracture or focal lesion. Other: None. CT MAXILLOFACIAL FINDINGS Osseous: No acute bony abnormality is noted. Orbits: Orbits and their contents are within normal limits. Sinuses: Paranasal sinuses are well aerated bilaterally. Soft tissues: There is a cystic appearing lesion deep to the right parotid gland in the parapharyngeal space on the right. It measures 4 by 1.4 cm in greatest transverse and AP dimensions. Few tiny calcifications are noted within. A fat plane is noted between it and the deep aspect of the right parotid gland although this may still represent a salivary gland tumor. Other considerations such as nerve sheath tumor or lymphangioma would deserve consideration as well. No other soft tissue abnormality is noted. CT CERVICAL SPINE FINDINGS Alignment: Within normal limits. Skull base and vertebrae: 7 cervical segments are well visualized. Vertebral body height is well maintained. Disc space narrowing is noted from C3 to C7. Facet hypertrophic changes are noted. No acute fracture or acute facet abnormality is noted. The odontoid is within normal limits. Soft tissues and spinal canal: Surrounding soft tissue structures again demonstrate the previously described ovoid cystic lesion deep to the right parotid gland. No other focal abnormality is noted. Upper chest: Visualized lung apices show evidence of a left-sided pleural effusion but incompletely evaluated on this exam. Other: None IMPRESSION: CT of the head: Mild atrophic changes without acute intracranial abnormality. CT of the maxillofacial bones: No acute bony abnormality is noted. Ovoid cystic lesion deep to the right parotid gland in the parapharyngeal space as described. Nonemergent MRI may be helpful for further evaluation. CT of the cervical spine: Multilevel degenerative change without acute bony  abnormality. Left-sided pleural effusion seen on the last image and incompletely evaluated on this exam. Electronically Signed   By: Inez Catalina M.D.   On: 07/10/2019 07:53   Ct Cervical Spine Wo Contrast  Result Date: 07/10/2019 CLINICAL DATA:  Fall from bed with forehead lacerations, initial encounter EXAM: CT HEAD WITHOUT CONTRAST CT MAXILLOFACIAL WITHOUT CONTRAST CT CERVICAL SPINE WITHOUT CONTRAST TECHNIQUE: Multidetector CT imaging of the head, cervical spine, and maxillofacial structures were performed using the standard protocol without intravenous contrast. Multiplanar CT image reconstructions of the cervical spine and maxillofacial structures were also generated. COMPARISON:  None. FINDINGS: CT HEAD FINDINGS Brain: Mild atrophic changes are noted. No findings to suggest acute hemorrhage, acute infarction or space-occupying mass lesion are noted. Vascular: No hyperdense vessel or unexpected calcification. Skull: Normal. Negative for fracture or focal lesion. Other: None. CT MAXILLOFACIAL FINDINGS Osseous: No acute bony abnormality is noted. Orbits: Orbits and their contents are within normal limits. Sinuses: Paranasal sinuses are well aerated bilaterally. Soft tissues: There is a cystic appearing lesion deep to the right parotid gland in the parapharyngeal space on the right. It measures 4 by 1.4 cm in greatest transverse and AP dimensions. Few tiny calcifications are noted within. A fat plane is noted between it and the deep aspect of the right parotid gland although this may still represent a salivary gland tumor. Other considerations such as nerve sheath tumor or lymphangioma would deserve consideration as well. No other soft tissue abnormality is noted. CT CERVICAL SPINE FINDINGS Alignment: Within normal limits. Skull base and vertebrae: 7 cervical segments are well visualized. Vertebral body height is well maintained. Disc space narrowing is noted from C3 to C7. Facet hypertrophic changes are  noted. No acute fracture or acute facet abnormality is noted. The odontoid is within normal limits. Soft tissues and spinal canal: Surrounding soft tissue structures again demonstrate the previously described ovoid cystic lesion deep to the right parotid gland. No other focal abnormality is noted. Upper chest: Visualized lung apices show evidence of a left-sided pleural effusion but incompletely evaluated on this exam. Other: None IMPRESSION: CT of the head: Mild atrophic changes without acute intracranial abnormality. CT of the maxillofacial bones: No acute bony abnormality is noted. Ovoid cystic lesion deep to the right parotid gland in the parapharyngeal space as described. Nonemergent MRI may be helpful for further evaluation. CT of the cervical spine: Multilevel degenerative change without acute bony abnormality. Left-sided pleural effusion seen on the last image and incompletely evaluated on this exam. Electronically Signed   By: Inez Catalina M.D.   On: 07/10/2019 07:53   Dg Chest Port 1 View  Result Date: 07/10/2019 CLINICAL DATA:  Fall out of bed EXAM: PORTABLE CHEST 1 VIEW COMPARISON:  None. FINDINGS: Moderate to large right pleural effusion with associated atelectasis or consolidation. No obvious displaced rib fracture or pneumothorax. There is diffuse bilateral heterogeneous and interstitial pulmonary opacity. Mild cardiomegaly. IMPRESSION: Moderate to large right pleural effusion with associated atelectasis or consolidation. No obvious displaced rib fracture or pneumothorax. There is diffuse bilateral heterogeneous and interstitial pulmonary opacity. Mild cardiomegaly. Findings are concerning for multifocal infection with large associated effusion although malignancy is a differential consideration. Consider CT to further evaluate. Electronically Signed   By: Eddie Candle M.D.   On: 07/10/2019 08:26   Ct Maxillofacial Wo Contrast  Result Date: 07/10/2019 CLINICAL DATA:  Fall from bed with  forehead lacerations, initial encounter EXAM: CT HEAD WITHOUT CONTRAST CT MAXILLOFACIAL WITHOUT CONTRAST CT CERVICAL SPINE WITHOUT CONTRAST TECHNIQUE: Multidetector CT imaging of the head, cervical spine, and maxillofacial structures were performed using the standard protocol without intravenous contrast. Multiplanar CT image reconstructions of the cervical spine and maxillofacial structures were also generated. COMPARISON:  None. FINDINGS: CT HEAD FINDINGS Brain: Mild atrophic changes are noted. No findings to suggest acute hemorrhage, acute infarction or space-occupying mass lesion are noted. Vascular: No hyperdense vessel or unexpected calcification. Skull: Normal. Negative for fracture or focal lesion. Other: None. CT MAXILLOFACIAL FINDINGS Osseous: No acute bony abnormality is noted. Orbits: Orbits and their contents are within normal limits. Sinuses: Paranasal sinuses  are well aerated bilaterally. Soft tissues: There is a cystic appearing lesion deep to the right parotid gland in the parapharyngeal space on the right. It measures 4 by 1.4 cm in greatest transverse and AP dimensions. Few tiny calcifications are noted within. A fat plane is noted between it and the deep aspect of the right parotid gland although this may still represent a salivary gland tumor. Other considerations such as nerve sheath tumor or lymphangioma would deserve consideration as well. No other soft tissue abnormality is noted. CT CERVICAL SPINE FINDINGS Alignment: Within normal limits. Skull base and vertebrae: 7 cervical segments are well visualized. Vertebral body height is well maintained. Disc space narrowing is noted from C3 to C7. Facet hypertrophic changes are noted. No acute fracture or acute facet abnormality is noted. The odontoid is within normal limits. Soft tissues and spinal canal: Surrounding soft tissue structures again demonstrate the previously described ovoid cystic lesion deep to the right parotid gland. No other  focal abnormality is noted. Upper chest: Visualized lung apices show evidence of a left-sided pleural effusion but incompletely evaluated on this exam. Other: None IMPRESSION: CT of the head: Mild atrophic changes without acute intracranial abnormality. CT of the maxillofacial bones: No acute bony abnormality is noted. Ovoid cystic lesion deep to the right parotid gland in the parapharyngeal space as described. Nonemergent MRI may be helpful for further evaluation. CT of the cervical spine: Multilevel degenerative change without acute bony abnormality. Left-sided pleural effusion seen on the last image and incompletely evaluated on this exam. Electronically Signed   By: Inez Catalina M.D.   On: 07/10/2019 07:53    Blood pressure (!) 157/85, pulse 93, temperature 98.1 F (36.7 C), temperature source Oral, resp. rate 19, height 6' (1.829 m), weight 99.3 kg, SpO2 92 %.  Mental status: Alert and Oriented x 4.  Generally the patient is pleasant and conversant, ambulates with assistance.  Mild increased work of breathing.  Visual Acuity:  20/70 OD  20/70 near , but patient   Pupils:  Right is round, reactive.  Left pupil is peaked temporally with iris prolapse.  Motility:  Full/ orthophoric  Visual Fields:  Full to confrontation  IOP:  20 in the right eye.  29, 31 in the left eye by tonopen.  External/ Lids/ Lashes:  Small laceration above left [place].  Mild symmetric proptosis, globes ballotable.  Anterior Segment:  Conjunctiva:  1+ hyperemia OS  Cornea:  Normal  OU, but moderate pressure on the left globe demonstrated positive Seidel through temporal cataract clear corneal incision.  Anterior Chamber: Normal in the right eye, but 2+ heme in the left eye.    Lens:   IOLs well centered and stable OU  Posterior Segment: did not dilate, but large pupil in the left facilitated posterior exam OS.  Retina is flat, no hemorrhages.  Did not perform scleral depression.    Discs:   Mild cupping OS with  20D lens.  Macula:  Flat, wnl OS.  Vessels/ Periphery: Flat OS.    Assessment/Plan: 1. 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  This is organ threatening and requires urgent surgical repair under general anesthesia.  Patient reports ate lunch in the ED: [place] sandwich apple sauce at 11:30 am.  Consent signed and witnessed by nurse Larene Beach.  -- NPO, to [facility] OR room 6 with Dr. Edison Pace and eye team ASAP for open globe repair. -- Vigamox eye drops 6x/day left eye. --  eye  shield in place.  Do not manipulate the eye, no pressure on the eye.   Benay Pillow 07/10/2019, 12:03 PM

## 2019-07-10 NOTE — Progress Notes (Signed)
Patient from PACU 2111, receieved report from [place].  No pain, AOx4, VSS. Patient swallows fine. Will continue to monitor. Stacie Glaze, RN

## 2019-07-10 NOTE — ED Notes (Signed)
MD at bedside to assess pts left eye.   Meal tray has been provided and water at bedside.

## 2019-07-10 NOTE — ED Triage Notes (Signed)
Pt arrives to ED via [facility] from home with c/o forehead and left eye lacerations s/p rolling out of bed PTA. Pt denies any LOC; pt reports some neck pain but denies upper or lower back pain. Pt denies use of anticoagulants. Pt is A&O, in NAD; RR even, regular, and unlabored.

## 2019-07-10 NOTE — Anesthesia Preprocedure Evaluation (Addendum)
Anesthesia Evaluation  Patient identified by MRN, date of birth, ID band Patient awake    Reviewed: Allergy & Precautions, NPO status , Patient's Chart, lab work & pertinent test results  Airway Mallampati: III       Dental  (+) Chipped   Pulmonary shortness of breath and with exertion, former smoker,  Large pleural effusion   Pulmonary exam normal        Cardiovascular hypertension, + Peripheral Vascular Disease and +CHF  Normal cardiovascular exam     Neuro/Psych negative neurological ROS  negative psych ROS   GI/Hepatic Neg liver ROS, GERD  ,  Endo/Other  diabetes  Renal/GU negative Renal ROS     Musculoskeletal   Abdominal Normal abdominal exam  (+)   Peds negative pediatric ROS (+)  Hematology   Anesthesia Other Findings Past Medical History: No date: Multiple myeloma ([place]) Open angle glaucoma  Reproductive/Obstetrics                           Anesthesia Physical Anesthesia Plan  ASA: III and emergent  Anesthesia Plan: General   Post-op Pain Management:    Induction: Intravenous, Rapid sequence and Cricoid pressure planned  PONV Risk Score and Plan:   Airway Management Planned: Oral ETT  Additional Equipment:   Intra-op Plan:   Post-operative Plan: Extubation in OR  Informed Consent: I have reviewed the patients History and Physical, chart, labs and discussed the procedure including the risks, benefits and alternatives for the proposed anesthesia with the patient or authorized representative who has indicated his/her understanding and acceptance.     Dental advisory given  Plan Discussed with: CRNA and Surgeon  Anesthesia Plan Comments:        Anesthesia Quick Evaluation

## 2019-07-10 NOTE — ED Notes (Signed)
Pt transported to eye exam room for further evaluation.

## 2019-07-10 NOTE — ED Notes (Signed)
ED TO INPATIENT HANDOFF REPORT  ED Nurse Name and Phone #:  Vicente Males [phone]  S Name/Age/Gender Dan Miranda. 78 y.o. male Room/Bed: ED33A/ED33A  Code Status   Code Status: Full Code  Home/SNF/Other Home Patient oriented to: self, place, time and situation Is this baseline? Yes   Triage Complete: Triage complete  Chief Complaint ems- laceration  Triage Note Pt arrives to ED via [facility] from home with c/o forehead and left eye lacerations s/p rolling out of bed PTA. Pt denies any LOC; pt reports some neck pain but denies upper or lower back pain. Pt denies use of anticoagulants. Pt is A&O, in NAD; RR even, regular, and unlabored.   Allergies Allergies  Allergen Reactions  . Benazepril Other (See Comments)  . Lemon Oil Other (See Comments)  . Lime, Sulfurated Other (See Comments)    Level of Care/Admitting Diagnosis ED Disposition    ED Disposition Condition Comment   Admit  Hospital Area: [facility] [[id number]]  Level of Care: Med-Surg [16]  Covid Evaluation: Confirmed COVID Negative  Diagnosis: Large pleural effusion [[id number]]  Admitting Physician: Max Sane [[id number]]  Attending Physician: Max Sane [[id number]]  Estimated length of stay: past midnight tomorrow  Certification:: I certify this patient will need inpatient services for at least 2 midnights  PT Class (Do Not Modify): Inpatient [101]  PT Acc Code (Do Not Modify): Private [1]       B Medical/Surgery History Past Medical History:  Diagnosis Date  . Multiple myeloma [facility])    Past Surgical History:  Procedure Laterality Date  . APPENDECTOMY    . CHOLECYSTECTOMY    . HERNIA REPAIR       A IV Location/Drains/Wounds Patient Lines/Drains/Airways Status   Active Line/Drains/Airways    Name:   Placement date:   Placement time:   Site:   Days:   Peripheral IV 07/10/19 Left Forearm   07/10/19    0844    Forearm   less than 1          Intake/Output Last 24 hours No intake or  output data in the 24 hours ending 07/10/19 1651  Labs/Imaging Results for orders placed or performed during the hospital encounter of 07/10/19 (from the past 48 hour(s))  Comprehensive metabolic panel     Status: Abnormal   Collection Time: 07/10/19  8:35 AM  Result Value Ref Range   Sodium 145 135 - 145 mmol/L   Potassium 3.3 (L) 3.5 - 5.1 mmol/L   Chloride 111 98 - 111 mmol/L   CO2 26 22 - 32 mmol/L   Glucose, Bld 101 (H) 70 - 99 mg/dL   BUN 10 8 - 23 mg/dL   Creatinine, Ser 0.80 0.61 - 1.24 mg/dL   Calcium 8.5 (L) 8.9 - 10.3 mg/dL   Total Protein 7.4 6.5 - 8.1 g/dL   Albumin 2.9 (L) 3.5 - 5.0 g/dL   AST 18 15 - 41 U/L   ALT 14 0 - 44 U/L   Alkaline Phosphatase 98 38 - 126 U/L   Total Bilirubin 2.5 (H) 0.3 - 1.2 mg/dL   GFR calc non Af Amer >60 >60 mL/min   GFR calc Af Amer >60 >60 mL/min   Anion gap 8 5 - 15    Comment: Performed at [facility], [address] [place], [place] [address]  CBC     Status: Abnormal   Collection Time: 07/10/19  8:35 AM  Result Value Ref Range   WBC 4.6 4.0 -  10.5 K/uL   RBC 3.79 (L) 4.22 - 5.81 MIL/uL   Hemoglobin 9.7 (L) 13.0 - 17.0 g/dL   HCT 31.5 (L) 39.0 - 52.0 %   MCV 83.1 80.0 - 100.0 fL   MCH 25.6 (L) 26.0 - 34.0 pg   MCHC 30.8 30.0 - 36.0 g/dL   RDW 19.9 (H) 11.5 - 15.5 %   Platelets 215 150 - 400 K/uL   nRBC 0.0 0.0 - 0.2 %    Comment: Performed at [facility], [address] [place], [place] [address]  Brain natriuretic peptide     Status: Abnormal   Collection Time: 07/10/19  8:35 AM  Result Value Ref Range   B Natriuretic Peptide 600.0 (H) 0.0 - 100.0 pg/mL    Comment: Performed at [facility], [place], [place] [address]  Troponin I (High Sensitivity)     Status: Abnormal   Collection Time: 07/10/19  8:35 AM  Result Value Ref Range   Troponin I (High Sensitivity) 35 (H) <18 ng/L    Comment: (NOTE) Elevated high sensitivity troponin I (hsTnI) values and significant   changes across serial measurements may suggest ACS but many other  chronic and acute conditions are known to elevate hsTnI results.  Refer to the "Links" section for chest pain algorithms and additional  guidance. Performed at [facility], [address], [place], [place] [address]   SARS Coronavirus 2 (CEPHEID - Performed in [facility] hospital lab), Hosp Order     Status: None   Collection Time: 07/10/19  8:35 AM   Specimen: Nasopharyngeal Swab  Result Value Ref Range   SARS Coronavirus 2 NEGATIVE NEGATIVE    Comment: (NOTE) If result is NEGATIVE SARS-CoV-2 target nucleic acids are NOT DETECTED. The SARS-CoV-2 RNA is generally detectable in upper and lower  respiratory specimens during the acute phase of infection. The lowest  concentration of SARS-CoV-2 viral copies this assay can detect is 250  copies / mL. A negative result does not preclude SARS-CoV-2 infection  and should not be used as the sole basis for treatment or other  patient management decisions.  A negative result may occur with  improper specimen collection / handling, submission of specimen other  than nasopharyngeal swab, presence of viral mutation(s) within the  areas targeted by this assay, and inadequate number of viral copies  (<250 copies / mL). A negative result must be combined with clinical  observations, patient history, and epidemiological information. If result is POSITIVE SARS-CoV-2 target nucleic acids are DETECTED. The SARS-CoV-2 RNA is generally detectable in upper and lower  respiratory specimens dur ing the acute phase of infection.  Positive  results are indicative of active infection with SARS-CoV-2.  Clinical  correlation with patient history and other diagnostic information is  necessary to determine patient infection status.  Positive results do  not rule out bacterial infection or co-infection with other viruses. If result is PRESUMPTIVE POSTIVE SARS-CoV-2 nucleic acids MAY  BE PRESENT.   A presumptive positive result was obtained on the submitted specimen  and confirmed on repeat testing.  While 2019 novel coronavirus  (SARS-CoV-2) nucleic acids may be present in the submitted sample  additional confirmatory testing may be necessary for epidemiological  and / or clinical management purposes  to differentiate between  SARS-CoV-2 and other Sarbecovirus currently known to infect humans.  If clinically indicated additional testing with an alternate test  methodology [phone]) is advised. The SARS-CoV-2 RNA is generally  detectable in  upper and lower respiratory sp ecimens during the acute  phase of infection. The expected result is Negative. Fact Sheet for Patients:  StrictlyIdeas.no Fact Sheet for Healthcare Providers: BankingDealers.co.za This test is not yet approved or cleared by the [place] FDA and has been authorized for detection and/or diagnosis of SARS-CoV-2 by FDA under an Emergency Use Authorization (EUA).  This EUA will remain in effect (meaning this test can be used) for the duration of the COVID-19 declaration under Section 564(b)(1) of the Act, 21 U.S.C. section 360bbb-3(b)(1), unless the authorization is terminated or revoked sooner. Performed at [facility], [place], [place] [address]   Troponin I (High Sensitivity)     Status: Abnormal   Collection Time: 07/10/19  9:48 AM  Result Value Ref Range   Troponin I (High Sensitivity) 30 (H) <18 ng/L    Comment: (NOTE) Elevated high sensitivity troponin I (hsTnI) values and significant  changes across serial measurements may suggest ACS but many other  chronic and acute conditions are known to elevate hsTnI results.  Refer to the "Links" section for chest pain algorithms and additional  guidance. Performed at [facility], [place], [place] [address]    Ct Head Wo Contrast  Result Date:  07/10/2019 CLINICAL DATA:  Fall from bed with forehead lacerations, initial encounter EXAM: CT HEAD WITHOUT CONTRAST CT MAXILLOFACIAL WITHOUT CONTRAST CT CERVICAL SPINE WITHOUT CONTRAST TECHNIQUE: Multidetector CT imaging of the head, cervical spine, and maxillofacial structures were performed using the standard protocol without intravenous contrast. Multiplanar CT image reconstructions of the cervical spine and maxillofacial structures were also generated. COMPARISON:  None. FINDINGS: CT HEAD FINDINGS Brain: Mild atrophic changes are noted. No findings to suggest acute hemorrhage, acute infarction or space-occupying mass lesion are noted. Vascular: No hyperdense vessel or unexpected calcification. Skull: Normal. Negative for fracture or focal lesion. Other: None. CT MAXILLOFACIAL FINDINGS Osseous: No acute bony abnormality is noted. Orbits: Orbits and their contents are within normal limits. Sinuses: Paranasal sinuses are well aerated bilaterally. Soft tissues: There is a cystic appearing lesion deep to the right parotid gland in the parapharyngeal space on the right. It measures 4 by 1.4 cm in greatest transverse and AP dimensions. Few tiny calcifications are noted within. A fat plane is noted between it and the deep aspect of the right parotid gland although this may still represent a salivary gland tumor. Other considerations such as nerve sheath tumor or lymphangioma would deserve consideration as well. No other soft tissue abnormality is noted. CT CERVICAL SPINE FINDINGS Alignment: Within normal limits. Skull base and vertebrae: 7 cervical segments are well visualized. Vertebral body height is well maintained. Disc space narrowing is noted from C3 to C7. Facet hypertrophic changes are noted. No acute fracture or acute facet abnormality is noted. The odontoid is within normal limits. Soft tissues and spinal canal: Surrounding soft tissue structures again demonstrate the previously described ovoid cystic lesion  deep to the right parotid gland. No other focal abnormality is noted. Upper chest: Visualized lung apices show evidence of a left-sided pleural effusion but incompletely evaluated on this exam. Other: None IMPRESSION: CT of the head: Mild atrophic changes without acute intracranial abnormality. CT of the maxillofacial bones: No acute bony abnormality is noted. Ovoid cystic lesion deep to the right parotid gland in the parapharyngeal space as described. Nonemergent MRI may be helpful for further evaluation. CT of the cervical spine: Multilevel degenerative change without acute bony abnormality. Left-sided pleural effusion seen  on the last image and incompletely evaluated on this exam. Electronically Signed   By: Inez Catalina M.D.   On: 07/10/2019 07:53   Ct Chest Wo Contrast  Result Date: 07/10/2019 CLINICAL DATA:  Pt arrives to ED via [facility] from home with c/o forehead and left eye lacerations s/p rolling out of bed PTA. Pt denies any LOC; pt reports some neck pain but denies upper or lower back pain. Pt denies use of anticoagulants EXAM: CT CHEST WITHOUT CONTRAST TECHNIQUE: Multidetector CT imaging of the chest was performed following the standard protocol without IV contrast. COMPARISON:  Current chest radiograph. FINDINGS: Cardiovascular: Heart is top-normal in size. There is a small pericardial effusion. Dense three-vessel coronary artery calcifications. Mild enlargement of main pulmonary artery to 3.8 cm. Normal caliber abdominal aorta. Aortic and arch vessel atherosclerotic calcifications. Mediastinum/Nodes: No neck base or axillary masses or pathologically enlarged lymph nodes. Thyroid is normal in size. There are scattered prominent mediastinal lymph nodes, largest a precarinal node measuring 1.5 cm in short axis. No mediastinal masses. No hilar masses or defined enlarged lymph nodes. Lungs/Pleura: Large right pleural effusion and small left pleural effusion. There are pleural based calcified plaques most  evident along the hemidiaphragm surfaces. The right lower lobe is mostly collapsed. There is partial collapse of the right middle lobe. Irregular linear opacities are noted in the right upper lobe, consistent with atelectasis or scarring, along with mild interstitial thickening. Minimal atelectasis in the dependent left lower lobe. There is irregular opacity in the left upper lobe that may reflect atelectasis or scarring. No lung mass or suspicious nodule. There is a questionable partial filling defect within lower lobe segmental bronchi centrally. No pneumothorax. Upper Abdomen: No acute findings. Small low-density lesion at the dome of the liver consistent with a cyst. Musculoskeletal: No fracture or acute finding. Degenerative changes noted throughout the visualized spine as well as bridging anterior osteophytes suggesting DISH. No osteoblastic or osteolytic lesions. IMPRESSION: 1. Large right and small left pleural effusions. 2. Near complete atelectasis of the right lower lobe partial atelectasis of the right upper lobe. Questionable partial filling defect and a central lower lobe bronchus. Consider follow-up bronchoscopy. 3. Other areas of lung opacity are consistent with scarring or additional atelectasis. No convincing pneumonia. 4. 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 5. Small pericardial effusion. Dense three-vessel coronary artery calcifications. 6. Aortic atherosclerosis Aortic Atherosclerosis (ICD10-I70.0). Electronically Signed   By: Lajean Manes M.D.   On: 07/10/2019 13:18   Ct Cervical Spine Wo Contrast  Result Date: 07/10/2019 CLINICAL DATA:  Fall from bed with forehead lacerations, initial encounter EXAM: CT HEAD WITHOUT CONTRAST CT MAXILLOFACIAL WITHOUT CONTRAST CT CERVICAL SPINE WITHOUT CONTRAST TECHNIQUE: Multidetector CT imaging of the head, cervical spine, and maxillofacial structures were performed using the standard protocol without intravenous  contrast. Multiplanar CT image reconstructions of the cervical spine and maxillofacial structures were also generated. COMPARISON:  None. FINDINGS: CT HEAD FINDINGS Brain: Mild atrophic changes are noted. No findings to suggest acute hemorrhage, acute infarction or space-occupying mass lesion are noted. Vascular: No hyperdense vessel or unexpected calcification. Skull: Normal. Negative for fracture or focal lesion. Other: None. CT MAXILLOFACIAL FINDINGS Osseous: No acute bony abnormality is noted. Orbits: Orbits and their contents are within normal limits. Sinuses: Paranasal sinuses are well aerated bilaterally. Soft tissues: There is a cystic appearing lesion deep to the right parotid gland in the parapharyngeal space on the right. It measures 4 by 1.4 cm in greatest transverse and  AP dimensions. Few tiny calcifications are noted within. A fat plane is noted between it and the deep aspect of the right parotid gland although this may still represent a salivary gland tumor. Other considerations such as nerve sheath tumor or lymphangioma would deserve consideration as well. No other soft tissue abnormality is noted. CT CERVICAL SPINE FINDINGS Alignment: Within normal limits. Skull base and vertebrae: 7 cervical segments are well visualized. Vertebral body height is well maintained. Disc space narrowing is noted from C3 to C7. Facet hypertrophic changes are noted. No acute fracture or acute facet abnormality is noted. The odontoid is within normal limits. Soft tissues and spinal canal: Surrounding soft tissue structures again demonstrate the previously described ovoid cystic lesion deep to the right parotid gland. No other focal abnormality is noted. Upper chest: Visualized lung apices show evidence of a left-sided pleural effusion but incompletely evaluated on this exam. Other: None IMPRESSION: CT of the head: Mild atrophic changes without acute intracranial abnormality. CT of the maxillofacial bones: No acute bony  abnormality is noted. Ovoid cystic lesion deep to the right parotid gland in the parapharyngeal space as described. Nonemergent MRI may be helpful for further evaluation. CT of the cervical spine: Multilevel degenerative change without acute bony abnormality. Left-sided pleural effusion seen on the last image and incompletely evaluated on this exam. Electronically Signed   By: Inez Catalina M.D.   On: 07/10/2019 07:53   Dg Chest Port 1 View  Result Date: 07/10/2019 CLINICAL DATA:  Fall out of bed EXAM: PORTABLE CHEST 1 VIEW COMPARISON:  None. FINDINGS: Moderate to large right pleural effusion with associated atelectasis or consolidation. No obvious displaced rib fracture or pneumothorax. There is diffuse bilateral heterogeneous and interstitial pulmonary opacity. Mild cardiomegaly. IMPRESSION: Moderate to large right pleural effusion with associated atelectasis or consolidation. No obvious displaced rib fracture or pneumothorax. There is diffuse bilateral heterogeneous and interstitial pulmonary opacity. Mild cardiomegaly. Findings are concerning for multifocal infection with large associated effusion although malignancy is a differential consideration. Consider CT to further evaluate. Electronically Signed   By: Eddie Candle M.D.   On: 07/10/2019 08:26   Ct Maxillofacial Wo Contrast  Result Date: 07/10/2019 CLINICAL DATA:  Fall from bed with forehead lacerations, initial encounter EXAM: CT HEAD WITHOUT CONTRAST CT MAXILLOFACIAL WITHOUT CONTRAST CT CERVICAL SPINE WITHOUT CONTRAST TECHNIQUE: Multidetector CT imaging of the head, cervical spine, and maxillofacial structures were performed using the standard protocol without intravenous contrast. Multiplanar CT image reconstructions of the cervical spine and maxillofacial structures were also generated. COMPARISON:  None. FINDINGS: CT HEAD FINDINGS Brain: Mild atrophic changes are noted. No findings to suggest acute hemorrhage, acute infarction or  space-occupying mass lesion are noted. Vascular: No hyperdense vessel or unexpected calcification. Skull: Normal. Negative for fracture or focal lesion. Other: None. CT MAXILLOFACIAL FINDINGS Osseous: No acute bony abnormality is noted. Orbits: Orbits and their contents are within normal limits. Sinuses: Paranasal sinuses are well aerated bilaterally. Soft tissues: There is a cystic appearing lesion deep to the right parotid gland in the parapharyngeal space on the right. It measures 4 by 1.4 cm in greatest transverse and AP dimensions. Few tiny calcifications are noted within. A fat plane is noted between it and the deep aspect of the right parotid gland although this may still represent a salivary gland tumor. Other considerations such as nerve sheath tumor or lymphangioma would deserve consideration as well. No other soft tissue abnormality is noted. CT CERVICAL SPINE FINDINGS Alignment: Within normal limits. Skull base  and vertebrae: 7 cervical segments are well visualized. Vertebral body height is well maintained. Disc space narrowing is noted from C3 to C7. Facet hypertrophic changes are noted. No acute fracture or acute facet abnormality is noted. The odontoid is within normal limits. Soft tissues and spinal canal: Surrounding soft tissue structures again demonstrate the previously described ovoid cystic lesion deep to the right parotid gland. No other focal abnormality is noted. Upper chest: Visualized lung apices show evidence of a left-sided pleural effusion but incompletely evaluated on this exam. Other: None IMPRESSION: CT of the head: Mild atrophic changes without acute intracranial abnormality. CT of the maxillofacial bones: No acute bony abnormality is noted. Ovoid cystic lesion deep to the right parotid gland in the parapharyngeal space as described. Nonemergent MRI may be helpful for further evaluation. CT of the cervical spine: Multilevel degenerative change without acute bony abnormality.  Left-sided pleural effusion seen on the last image and incompletely evaluated on this exam. Electronically Signed   By: Inez Catalina M.D.   On: 07/10/2019 07:53    Pending Labs Unresulted Labs (From admission, onward)    Start     Ordered   07/11/19 8119  Basic metabolic panel  Tomorrow morning,   STAT     07/10/19 1442   07/11/19 0500  CBC  Tomorrow morning,   STAT     07/10/19 1442   07/10/19 1635  Flow cytometry panel-leukemia/lymphoma work-up  RELEASE UPON ORDERING,   STAT     07/10/19 1635   07/10/19 1635  PH, Body Fluid  RELEASE UPON ORDERING,   STAT     07/10/19 1635   07/10/19 1635  Acid Fast Culture with reflexed sensitivities  RELEASE UPON ORDERING,   STAT     07/10/19 1635   07/10/19 1635  Acid Fast Smear (AFB)  RELEASE UPON ORDERING,   STAT     07/10/19 1635   07/10/19 1635  Gram stain  RELEASE UPON ORDERING,   STAT     07/10/19 1635   07/10/19 1635  Body fluid culture  RELEASE UPON ORDERING,   STAT     07/10/19 1635   07/10/19 1635  Cholesterol, body fluid  RELEASE UPON ORDERING,   STAT     07/10/19 1635   07/10/19 1635  Amylase, pleural or peritoneal fluid  RELEASE UPON ORDERING,   STAT     07/10/19 1635   07/10/19 1635  Albumin, pleural or peritoneal fluid  RELEASE UPON ORDERING,   STAT     07/10/19 1635   07/10/19 1635  Protein, pleural or peritoneal fluid  RELEASE UPON ORDERING,   STAT     07/10/19 1635   07/10/19 1635  Fungus Culture With Stain  RELEASE UPON ORDERING,   STAT     07/10/19 1635   07/10/19 1635  Glucose, pleural or peritoneal fluid  RELEASE UPON ORDERING,   STAT     07/10/19 1635   07/10/19 1635  Lactate dehydrogenase (pleural or peritoneal fluid)  RELEASE UPON ORDERING,   STAT     07/10/19 1635   07/10/19 1635  Triglycerides, Body Fluid  RELEASE UPON ORDERING,   STAT     07/10/19 1635   07/10/19 1635  Body fluid cell count with differential  RELEASE UPON ORDERING,   STAT     07/10/19 1635   07/10/19 0747  Urinalysis, Complete w Microscopic   ONCE - STAT,   STAT     07/10/19 0747  Vitals/Pain Today's Vitals   07/10/19 1100 07/10/19 1500 07/10/19 1610 07/10/19 1632  BP: (!) 157/85 (!) 150/76 (!) 139/94 (!) 156/80  Pulse: 93 94 92 84  Resp: '19 19  16  '$ Temp:    97.8 F (36.6 C)  TempSrc:    Oral  SpO2: 92% 92% 94% 95%  Weight:      Height:      PainSc:    3     Isolation Precautions No active isolations  Medications Medications  lidocaine (PF) (XYLOCAINE) 1 % injection (has no administration in time range)  0.9 %  sodium chloride infusion ( Intravenous New Bag/Given 07/10/19 1528)  acetaminophen (TYLENOL) tablet 650 mg (has no administration in time range)    Or  acetaminophen (TYLENOL) suppository 650 mg (has no administration in time range)  HYDROcodone-acetaminophen (NORCO/VICODIN) 5-325 MG per tablet 1-2 tablet (has no administration in time range)  traZODone (DESYREL) tablet 25 mg (has no administration in time range)  docusate sodium (COLACE) capsule 100 mg (has no administration in time range)  bisacodyl (DULCOLAX) EC tablet 5 mg (has no administration in time range)  ondansetron (ZOFRAN) tablet 4 mg (has no administration in time range)    Or  ondansetron (ZOFRAN) injection 4 mg (has no administration in time range)  Tdap (BOOSTRIX) injection 0.5 mL (has no administration in time range)  moxifloxacin (VIGAMOX) 0.5 % ophthalmic solution 1 drop (1 drop Left Eye Given 07/10/19 1549)    Mobility walks Moderate fall risk   Focused Assessments    R Recommendations: See Admitting Provider Note  Report given to:   Additional Notes:  Waiting for Surgery

## 2019-07-10 NOTE — Procedures (Signed)
Pre procedural Dx: Symptomatic Pleural effusion Post procedural Dx: Same  Successful US guided right sided thoracentesis yielding 1 L of serous pleural fluid.   Samples sent to lab for analysis.  EBL: None Complications: Procedure complicated by development of an asymptomatic ex vacuo pneumothorax, an expected finding given dense consolidation of the right middle and lobes on preceding chest CT.   Ronny Bacon, MD Pager #: [phone]

## 2019-07-10 NOTE — Anesthesia Post-op Follow-up Note (Signed)
Anesthesia QCDR form completed.        

## 2019-07-10 NOTE — ED Notes (Addendum)
Pt sitting in lobby in w/c with no distress noted; approx 1.5" laceration noted between eyebrows with small amount bleeding; swelling noted to left eyelid; pt denies HA or LOC; st rolled OOB and hit head; CT scans ordered and bed requested for pt for further evaluaiton, acuity changed

## 2019-07-10 NOTE — Transfer of Care (Signed)
Immediate Anesthesia Transfer of Care Note  Patient: Dan Miranda.  Procedure(s) Performed: REPAIR OF RUPTURED GLOBE (Left Eye)  Patient Location: PACU  Anesthesia Type:General  Level of Consciousness: drowsy and patient cooperative  Airway & Oxygen Therapy: Patient Spontanous Breathing and Patient connected to face mask oxygen  Post-op Assessment: Report given to RN and Post -op Vital signs reviewed and stable  Post vital signs: Reviewed and stable  Last Vitals:  Vitals Value Taken Time  BP 106/61 07/10/19 2011  Temp 36.3 C 07/10/19 2011  Pulse 78 07/10/19 2014  Resp 17 07/10/19 2014  SpO2 100 % 07/10/19 2014  Vitals shown include unvalidated device data.  Last Pain:  Vitals:   07/10/19 1715  TempSrc: Oral  PainSc: 0-No pain         Complications: No apparent anesthesia complications

## 2019-07-10 NOTE — ED Notes (Signed)
Patient taken to thoracentesis at this time by radiology staff.

## 2019-07-10 NOTE — H&P (Signed)
[place] [facility] [place] NAME: Dan Miranda    MR#:  [id number]  DATE OF BIRTH:  [birth year]  DATE OF ADMISSION:  07/10/2019  PRIMARY CARE PHYSICIAN: [place]  REQUESTING/REFERRING PHYSICIAN: Vanessa Wil[place], MD  CHIEF COMPLAINT:   Chief Complaint  Patient presents with   Laceration    HISTORY OF PRESENT ILLNESS:  Dan Miranda  is a 78 y.o. male with a known history of multiple myeloma who presents for fall.  Patient came from home after falling at his forehead and has a left eye laceration. It was mechanical nature in which he rolled out of bed.  Denies any LOC.  He reports blurry vision in the left eye and eye pain. Patient is also having pain on his forehead from a laceration.  He had viral pneumonia Oct 09, 2018 - Dec 30 2018 when he was [facility]. He had all kind of viral symptoms per his wife. Also had pleural effusion and Fluid was drained at [facility] in Dec. PAST MEDICAL HISTORY:   Past Medical History:  Diagnosis Date   Multiple myeloma ([place])     PAST SURGICAL HISTORY:   Past Surgical History:  Procedure Laterality Date   APPENDECTOMY     CHOLECYSTECTOMY     HERNIA REPAIR      SOCIAL HISTORY:   Social History   Tobacco Use   Smoking status: Former Smoker   Smokeless tobacco: Never Used  Substance Use Topics   Alcohol use: Not on file    FAMILY HISTORY:  No family history on file. Mother: died of cancer, had cirrhosis. 2 sisters died of cancer DRUG ALLERGIES:   Allergies  Allergen Reactions   Benazepril Other (See Comments)   Lemon Oil Other (See Comments)   Lime, Sulfurated Other (See Comments)    REVIEW OF SYSTEMS:   Review of Systems  Constitutional: Positive for malaise/fatigue. Negative for diaphoresis, fever and weight loss.  HENT: Negative for ear discharge, ear pain, hearing loss, nosebleeds, sore throat and tinnitus.   Eyes: Negative for blurred vision and pain.  Respiratory:  Negative for cough, hemoptysis, shortness of breath and wheezing.   Cardiovascular: Negative for chest pain, palpitations, orthopnea and leg swelling.  Gastrointestinal: Negative for abdominal pain, blood in stool, constipation, diarrhea, heartburn, nausea and vomiting.  Genitourinary: Negative for dysuria, frequency and urgency.  Musculoskeletal: Positive for falls. Negative for back pain and myalgias.  Skin: Negative for itching and rash.  Neurological: Negative for dizziness, tingling, tremors, focal weakness, seizures, weakness and headaches.  Psychiatric/Behavioral: Negative for depression. The patient is not nervous/anxious.     MEDICATIONS AT HOME:   Prior to Admission medications   Medication Sig Start Date End Date Taking? Authorizing Provider  acyclovir (ZOVIRAX) 400 MG tablet Take 400 mg by mouth 2 (two) times a day.   Yes [provider]  atorvastatin (LIPITOR) 80 MG tablet Take 40 mg by mouth at bedtime.   Yes [provider]  ferrous sulfate 325 (65 FE) MG tablet Take 325 mg by mouth daily.   Yes [provider]  finasteride (PROSCAR) 5 MG tablet Take 5 mg by mouth every evening.    Yes [provider]  pantoprazole (PROTONIX) 40 MG tablet Take 40 mg by mouth daily.   Yes [provider]  potassium chloride SA (K-DUR) 20 MEQ tablet Take 20 mEq by mouth 2 (two) times a day.   Yes [provider]  terazosin (  HYTRIN) 5 MG capsule Take 5 mg by mouth 2 (two) times daily.    Yes [provider]  torsemide (DEMADEX) 20 MG tablet Take 20 mg by mouth daily.  12/04/18 12/04/19 Yes [provider]  vitamin B-12 (CYANOCOBALAMIN) 1000 MCG tablet Take 1,000 mcg by mouth daily.   Yes [provider]      VITAL SIGNS:  Blood pressure (!) 157/85, pulse 93, temperature 98.1 F (36.7 C), temperature source Oral, resp. rate 19, height 6' (1.829 m), weight 99.3 kg, SpO2 92 %.  PHYSICAL EXAMINATION:  Physical  Exam  GENERAL:  78 y.o.-year-old patient lying in the bed with no acute distress.  EYES: Small laceration above left [place], left eye covered with patch HEENT: Head atraumatic, normocephalic. Oropharynx and nasopharynx clear.  NECK:  Supple, no jugular venous distention. No thyroid enlargement, no tenderness.  LUNGS: Normal breath sounds bilaterally, no wheezing, rales,rhonchi or crepitation. No use of accessory muscles of respiration.  CARDIOVASCULAR: S1, S2 normal. No murmurs, rubs, or gallops.  ABDOMEN: Soft, nontender, nondistended. Bowel sounds present. No organomegaly or mass.  EXTREMITIES: No pedal edema, cyanosis, or clubbing.  NEUROLOGIC: Cranial nerves II through XII are intact. Muscle strength 5/5 in all extremities. Sensation intact. Gait not checked.  PSYCHIATRIC: The patient is alert and oriented x 3.  SKIN: No obvious rash, lesion, or ulcer.   LABORATORY PANEL:   CBC Recent Labs  Lab 07/10/19 0835  WBC 4.6  HGB 9.7*  HCT 31.5*  PLT 215   ------------------------------------------------------------------------------------------------------------------  Chemistries  Recent Labs  Lab 07/10/19 0835  NA 145  K 3.3*  CL 111  CO2 26  GLUCOSE 101*  BUN 10  CREATININE 0.80  CALCIUM 8.5*  AST 18  ALT 14  ALKPHOS 98  BILITOT 2.5*   ------------------------------------------------------------------------------------------------------------------  Cardiac Enzymes No results for input(s): TROPONINI in the last 168 hours. ------------------------------------------------------------------------------------------------------------------  RADIOLOGY:  Ct Head Wo Contrast  Result Date: 07/10/2019 CLINICAL DATA:  Fall from bed with forehead lacerations, initial encounter EXAM: CT HEAD WITHOUT CONTRAST CT MAXILLOFACIAL WITHOUT CONTRAST CT CERVICAL SPINE WITHOUT CONTRAST TECHNIQUE: Multidetector CT imaging of the head, cervical spine, and maxillofacial structures were  performed using the standard protocol without intravenous contrast. Multiplanar CT image reconstructions of the cervical spine and maxillofacial structures were also generated. COMPARISON:  None. FINDINGS: CT HEAD FINDINGS Brain: Mild atrophic changes are noted. No findings to suggest acute hemorrhage, acute infarction or space-occupying mass lesion are noted. Vascular: No hyperdense vessel or unexpected calcification. Skull: Normal. Negative for fracture or focal lesion. Other: None. CT MAXILLOFACIAL FINDINGS Osseous: No acute bony abnormality is noted. Orbits: Orbits and their contents are within normal limits. Sinuses: Paranasal sinuses are well aerated bilaterally. Soft tissues: There is a cystic appearing lesion deep to the right parotid gland in the parapharyngeal space on the right. It measures 4 by 1.4 cm in greatest transverse and AP dimensions. Few tiny calcifications are noted within. A fat plane is noted between it and the deep aspect of the right parotid gland although this may still represent a salivary gland tumor. Other considerations such as nerve sheath tumor or lymphangioma would deserve consideration as well. No other soft tissue abnormality is noted. CT CERVICAL SPINE FINDINGS Alignment: Within normal limits. Skull base and vertebrae: 7 cervical segments are well visualized. Vertebral body height is well maintained. Disc space narrowing is noted from C3 to C7. Facet hypertrophic changes are noted. No acute fracture or acute facet abnormality is noted. The odontoid is  within normal limits. Soft tissues and spinal canal: Surrounding soft tissue structures again demonstrate the previously described ovoid cystic lesion deep to the right parotid gland. No other focal abnormality is noted. Upper chest: Visualized lung apices show evidence of a left-sided pleural effusion but incompletely evaluated on this exam. Other: None IMPRESSION: CT of the head: Mild atrophic changes without acute intracranial  abnormality. CT of the maxillofacial bones: No acute bony abnormality is noted. Ovoid cystic lesion deep to the right parotid gland in the parapharyngeal space as described. Nonemergent MRI may be helpful for further evaluation. CT of the cervical spine: Multilevel degenerative change without acute bony abnormality. Left-sided pleural effusion seen on the last image and incompletely evaluated on this exam. Electronically Signed   By: Inez Catalina M.D.   On: 07/10/2019 07:53   Ct Chest Wo Contrast  Result Date: 07/10/2019 CLINICAL DATA:  Pt arrives to ED via [facility] from home with c/o forehead and left eye lacerations s/p rolling out of bed PTA. Pt denies any LOC; pt reports some neck pain but denies upper or lower back pain. Pt denies use of anticoagulants EXAM: CT CHEST WITHOUT CONTRAST TECHNIQUE: Multidetector CT imaging of the chest was performed following the standard protocol without IV contrast. COMPARISON:  Current chest radiograph. FINDINGS: Cardiovascular: Heart is top-normal in size. There is a small pericardial effusion. Dense three-vessel coronary artery calcifications. Mild enlargement of main pulmonary artery to 3.8 cm. Normal caliber abdominal aorta. Aortic and arch vessel atherosclerotic calcifications. Mediastinum/Nodes: No neck base or axillary masses or pathologically enlarged lymph nodes. Thyroid is normal in size. There are scattered prominent mediastinal lymph nodes, largest a precarinal node measuring 1.5 cm in short axis. No mediastinal masses. No hilar masses or defined enlarged lymph nodes. Lungs/Pleura: Large right pleural effusion and small left pleural effusion. There are pleural based calcified plaques most evident along the hemidiaphragm surfaces. The right lower lobe is mostly collapsed. There is partial collapse of the right middle lobe. Irregular linear opacities are noted in the right upper lobe, consistent with atelectasis or scarring, along with mild interstitial thickening.  Minimal atelectasis in the dependent left lower lobe. There is irregular opacity in the left upper lobe that may reflect atelectasis or scarring. No lung mass or suspicious nodule. There is a questionable partial filling defect within lower lobe segmental bronchi centrally. No pneumothorax. Upper Abdomen: No acute findings. Small low-density lesion at the dome of the liver consistent with a cyst. Musculoskeletal: No fracture or acute finding. Degenerative changes noted throughout the visualized spine as well as bridging anterior osteophytes suggesting DISH. No osteoblastic or osteolytic lesions. IMPRESSION: 1. Large right and small left pleural effusions. 2. Near complete atelectasis of the right lower lobe partial atelectasis of the right upper lobe. Questionable partial filling defect and a central lower lobe bronchus. Consider follow-up bronchoscopy. 3. Other areas of lung opacity are consistent with scarring or additional atelectasis. No convincing pneumonia. 4. 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 5. Small pericardial effusion. Dense three-vessel coronary artery calcifications. 6. Aortic atherosclerosis Aortic Atherosclerosis (ICD10-I70.0). Electronically Signed   By: Lajean Manes M.D.   On: 07/10/2019 13:18   Ct Cervical Spine Wo Contrast  Result Date: 07/10/2019 CLINICAL DATA:  Fall from bed with forehead lacerations, initial encounter EXAM: CT HEAD WITHOUT CONTRAST CT MAXILLOFACIAL WITHOUT CONTRAST CT CERVICAL SPINE WITHOUT CONTRAST TECHNIQUE: Multidetector CT imaging of the head, cervical spine, and maxillofacial structures were performed using the standard protocol without intravenous contrast. Multiplanar  CT image reconstructions of the cervical spine and maxillofacial structures were also generated. COMPARISON:  None. FINDINGS: CT HEAD FINDINGS Brain: Mild atrophic changes are noted. No findings to suggest acute hemorrhage, acute infarction or  space-occupying mass lesion are noted. Vascular: No hyperdense vessel or unexpected calcification. Skull: Normal. Negative for fracture or focal lesion. Other: None. CT MAXILLOFACIAL FINDINGS Osseous: No acute bony abnormality is noted. Orbits: Orbits and their contents are within normal limits. Sinuses: Paranasal sinuses are well aerated bilaterally. Soft tissues: There is a cystic appearing lesion deep to the right parotid gland in the parapharyngeal space on the right. It measures 4 by 1.4 cm in greatest transverse and AP dimensions. Few tiny calcifications are noted within. A fat plane is noted between it and the deep aspect of the right parotid gland although this may still represent a salivary gland tumor. Other considerations such as nerve sheath tumor or lymphangioma would deserve consideration as well. No other soft tissue abnormality is noted. CT CERVICAL SPINE FINDINGS Alignment: Within normal limits. Skull base and vertebrae: 7 cervical segments are well visualized. Vertebral body height is well maintained. Disc space narrowing is noted from C3 to C7. Facet hypertrophic changes are noted. No acute fracture or acute facet abnormality is noted. The odontoid is within normal limits. Soft tissues and spinal canal: Surrounding soft tissue structures again demonstrate the previously described ovoid cystic lesion deep to the right parotid gland. No other focal abnormality is noted. Upper chest: Visualized lung apices show evidence of a left-sided pleural effusion but incompletely evaluated on this exam. Other: None IMPRESSION: CT of the head: Mild atrophic changes without acute intracranial abnormality. CT of the maxillofacial bones: No acute bony abnormality is noted. Ovoid cystic lesion deep to the right parotid gland in the parapharyngeal space as described. Nonemergent MRI may be helpful for further evaluation. CT of the cervical spine: Multilevel degenerative change without acute bony abnormality.  Left-sided pleural effusion seen on the last image and incompletely evaluated on this exam. Electronically Signed   By: Inez Catalina M.D.   On: 07/10/2019 07:53   Dg Chest Port 1 View  Result Date: 07/10/2019 CLINICAL DATA:  Fall out of bed EXAM: PORTABLE CHEST 1 VIEW COMPARISON:  None. FINDINGS: Moderate to large right pleural effusion with associated atelectasis or consolidation. No obvious displaced rib fracture or pneumothorax. There is diffuse bilateral heterogeneous and interstitial pulmonary opacity. Mild cardiomegaly. IMPRESSION: Moderate to large right pleural effusion with associated atelectasis or consolidation. No obvious displaced rib fracture or pneumothorax. There is diffuse bilateral heterogeneous and interstitial pulmonary opacity. Mild cardiomegaly. Findings are concerning for multifocal infection with large associated effusion although malignancy is a differential consideration. Consider CT to further evaluate. Electronically Signed   By: Eddie Candle M.D.   On: 07/10/2019 08:26   Ct Maxillofacial Wo Contrast  Result Date: 07/10/2019 CLINICAL DATA:  Fall from bed with forehead lacerations, initial encounter EXAM: CT HEAD WITHOUT CONTRAST CT MAXILLOFACIAL WITHOUT CONTRAST CT CERVICAL SPINE WITHOUT CONTRAST TECHNIQUE: Multidetector CT imaging of the head, cervical spine, and maxillofacial structures were performed using the standard protocol without intravenous contrast. Multiplanar CT image reconstructions of the cervical spine and maxillofacial structures were also generated. COMPARISON:  None. FINDINGS: CT HEAD FINDINGS Brain: Mild atrophic changes are noted. No findings to suggest acute hemorrhage, acute infarction or space-occupying mass lesion are noted. Vascular: No hyperdense vessel or unexpected calcification. Skull: Normal. Negative for fracture or focal lesion. Other: None. CT MAXILLOFACIAL FINDINGS Osseous: No acute bony  abnormality is noted. Orbits: Orbits and their contents are  within normal limits. Sinuses: Paranasal sinuses are well aerated bilaterally. Soft tissues: There is a cystic appearing lesion deep to the right parotid gland in the parapharyngeal space on the right. It measures 4 by 1.4 cm in greatest transverse and AP dimensions. Few tiny calcifications are noted within. A fat plane is noted between it and the deep aspect of the right parotid gland although this may still represent a salivary gland tumor. Other considerations such as nerve sheath tumor or lymphangioma would deserve consideration as well. No other soft tissue abnormality is noted. CT CERVICAL SPINE FINDINGS Alignment: Within normal limits. Skull base and vertebrae: 7 cervical segments are well visualized. Vertebral body height is well maintained. Disc space narrowing is noted from C3 to C7. Facet hypertrophic changes are noted. No acute fracture or acute facet abnormality is noted. The odontoid is within normal limits. Soft tissues and spinal canal: Surrounding soft tissue structures again demonstrate the previously described ovoid cystic lesion deep to the right parotid gland. No other focal abnormality is noted. Upper chest: Visualized lung apices show evidence of a left-sided pleural effusion but incompletely evaluated on this exam. Other: None IMPRESSION: CT of the head: Mild atrophic changes without acute intracranial abnormality. CT of the maxillofacial bones: No acute bony abnormality is noted. Ovoid cystic lesion deep to the right parotid gland in the parapharyngeal space as described. Nonemergent MRI may be helpful for further evaluation. CT of the cervical spine: Multilevel degenerative change without acute bony abnormality. Left-sided pleural effusion seen on the last image and incompletely evaluated on this exam. Electronically Signed   By: Inez Catalina M.D.   On: 07/10/2019 07:53   IMPRESSION AND PLAN:  28 y m admitted s/p fall  * Large rt pleural effusion: seen on CXR. 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  - Appreciate Ophtho input. - This is organ threatening and requires urgent surgical repair for open globe repair. -- Vigamox eye drops 6x/day left eye. --  eye shield in place.  Do not manipulate the eye, no pressure on the eye.  * Hypokalemia: replete and recheck  * Fall : likely multifactorial. PT, OT c/s   D/w brenda (spouse) over phone  All the records are reviewed and case discussed with ED provider. Management plans discussed with the patient, family (over phone) and they are in agreement.  CODE STATUS: FULL CODE  TOTAL TIME TAKING CARE OF THIS PATIENT: 35 minutes.    Max Sane M.D on 07/10/2019 at 1:26 PM  Between 7am to 6pm - Pager - [phone]  After 6pm go to [email or web address] - Proofreader  [facility]  Office  [phone]  CC: Primary care physician; Patient, No Pcp Per   Note: This dictation was prepared with Dragon dictation along with smaller phrase technology. Any transcriptional errors that result from this process are unintentional.

## 2019-07-11 ENCOUNTER — Inpatient Hospital Stay (HOSPITAL_COMMUNITY)
Admit: 2019-07-11 | Discharge: 2019-07-11 | Disposition: A | Discharge status: Home / Self Care | Payer: Medicare Other | Attending: Internal Medicine | Admitting: Internal Medicine

## 2019-07-11 ENCOUNTER — Encounter: Payer: Self-pay | Admitting: Ophthalmology

## 2019-07-11 DIAGNOSIS — Z7189 Other specified counseling: Secondary | ICD-10-CM

## 2019-07-11 DIAGNOSIS — Z515 Encounter for palliative care: Secondary | ICD-10-CM

## 2019-07-11 DIAGNOSIS — I340 Nonrheumatic mitral (valve) insufficiency: Secondary | ICD-10-CM

## 2019-07-11 DIAGNOSIS — I361 Nonrheumatic tricuspid (valve) insufficiency: Secondary | ICD-10-CM

## 2019-07-11 DIAGNOSIS — J90 Pleural effusion, not elsewhere classified: Secondary | ICD-10-CM

## 2019-07-11 LAB — BASIC METABOLIC PANEL
Anion gap: 8 (ref 5–15)
BUN: 12 mg/dL (ref 8–23)
CO2: 27 mmol/L (ref 22–32)
Calcium: 8.3 mg/dL — ABNORMAL LOW (ref 8.9–10.3)
Chloride: 111 mmol/L (ref 98–111)
Creatinine, Ser: 0.83 mg/dL (ref 0.61–1.24)
GFR calc Af Amer: >60 mL/min (ref >60)
GFR calc non Af Amer: >60 mL/min (ref >60)
Glucose, Bld: 132 mg/dL — ABNORMAL HIGH (ref 70–99)
Potassium: 3.4 mmol/L — ABNORMAL LOW (ref 3.5–5.1)
Sodium: 146 mmol/L — ABNORMAL HIGH (ref 135–145)

## 2019-07-11 LAB — CBC
HCT: 31.7 % — ABNORMAL LOW (ref 39.0–52.0)
Hemoglobin: 9.4 g/dL — ABNORMAL LOW (ref 13.0–17.0)
MCH: 25.4 pg — ABNORMAL LOW (ref 26.0–34.0)
MCHC: 29.7 g/dL — ABNORMAL LOW (ref 30.0–36.0)
MCV: 85.7 fL (ref 80.0–100.0)
Platelets: 194 10*3/uL (ref 150–400)
RBC: 3.7 MIL/uL — ABNORMAL LOW (ref 4.22–5.81)
RDW: 19.3 % — ABNORMAL HIGH (ref 11.5–15.5)
WBC: 3.6 10*3/uL — ABNORMAL LOW (ref 4.0–10.5)
nRBC: 0 % (ref 0.0–0.2)

## 2019-07-11 LAB — URINALYSIS, COMPLETE (UACMP) WITH MICROSCOPIC
Bacteria, UA: NONE SEEN
Bilirubin Urine: NEGATIVE
Glucose, UA: NEGATIVE mg/dL
Hgb urine dipstick: NEGATIVE
Ketones, ur: NEGATIVE mg/dL
Leukocytes,Ua: NEGATIVE
Nitrite: NEGATIVE
Protein, ur: NEGATIVE mg/dL
Specific Gravity, Urine: 1.016 (ref 1.005–1.030)
pH: 6 (ref 5.0–8.0)

## 2019-07-11 LAB — ECHOCARDIOGRAM COMPLETE
Height: 72 in
Weight: 3504 oz

## 2019-07-11 LAB — MAGNESIUM: Magnesium: 2.1 mg/dL (ref 1.7–2.4)

## 2019-07-11 MED ORDER — FUROSEMIDE 10 MG/ML IJ SOLN
40.0000 mg | Freq: Two times a day (BID) | INTRAMUSCULAR | Status: DC
  Administered 2019-07-11 – 2019-07-12 (×2): 40 mg via INTRAVENOUS
  Filled 2019-07-11 (×2): qty 4

## 2019-07-11 MED ORDER — POTASSIUM CHLORIDE CRYS ER 20 MEQ PO TBCR
40.0000 meq | EXTENDED_RELEASE_TABLET | Freq: Once | ORAL | Status: AC
  Administered 2019-07-11: 13:00:00 40 meq via ORAL
  Filled 2019-07-11: qty 2

## 2019-07-11 MED ORDER — ENSURE ENLIVE PO LIQD
237.0000 mL | Freq: Two times a day (BID) | ORAL | Status: DC
  Administered 2019-07-12: 237 mL via ORAL

## 2019-07-11 MED ORDER — PREDNISOLONE ACETATE 1 % OP SUSP
1.0000 [drp] | Freq: Every day | OPHTHALMIC | Status: DC
  Administered 2019-07-11 – 2019-07-12 (×6): 1 [drp] via OPHTHALMIC
  Filled 2019-07-11: qty 1

## 2019-07-11 MED ORDER — IPRATROPIUM-ALBUTEROL 0.5-2.5 (3) MG/3ML IN SOLN
3.0000 mL | Freq: Four times a day (QID) | RESPIRATORY_TRACT | Status: DC | PRN
  Administered 2019-07-11: 22:00:00 3 mL via RESPIRATORY_TRACT
  Filled 2019-07-11: qty 3

## 2019-07-11 NOTE — Progress Notes (Signed)
Please send eye drops with patient at discharge. Per MD, it is acceptable to give eyedrops together with 2 min in between eye drops.   Madlyn Frankel, RN

## 2019-07-11 NOTE — Progress Notes (Signed)
   07/11/19 1300  Clinical Encounter Type  Visited With Patient  Visit Type Initial  Spiritual Encounters  Spiritual Needs Brochure  Ch went in for AD education. Pt was sitting up and having finished eating lunch. Ch educated the pt and pt said he wants his wife and one of his daughters to be HCPOA. Pt said he was interested in the document and would review it. Pt also said that he was happy to be in the hospital so he could seek treatment and that he wasn't afraid of death. He said while he is here he would enjoy everyday. Ch felt such vibrancy, and positivity from the pt. The visit was appreciated. Pt will cal the ch once he decides to complete the document.

## 2019-07-11 NOTE — Consult Note (Addendum)
Consultation Note Date: 07/11/2019   Patient Name: Dan Miranda.  DOB: 08/01/1941  MRN: [id number]  Age / Sex: 78 y.o., male  PCP: Patient, No Pcp Per Referring Physician: Max Sane, MD  Reason for Consultation: Establishing goals of care  HPI/Patient Profile: Dan Miranda  is a 78 y.o. male with a known history of multiple myeloma who presents for fall. Patient came from home after falling at his forehead and has a left eye laceration.It was mechanical nature in which he rolled out of bed. Thoracentesis for pleural effusion. Operative management of left globe rupture.   Clinical Assessment and Goals of Care: Patient is sitting on the side of the bed. Eye shield in place. He smiles and is in good spirits. He states "I had eye surgery and fluid drained off my lungs and I'm still smiling!" He lives at home with his wife of 52 years. He has 3 living children, multiple grandchildren, and great grandchildren, one of which died at 30 years old. He states he had his wife keep the youngest great grandchild who is 16 years old several days per week.    He tells me of his hospitalization in January. He states "they wrote me off because all my organs were shutting down, but here I am." He states while in rehab following D/C from the hospital he was using a walker. They gave him exercises to do after discharge from SNF, and he states after a while he was able to walk without his walker. He states he grew stronger over time, and states when he fell out of the bed prior to this admission, he was able to get up quickly. He states his appetite is good.   He states he is followed at the [place]. He states his cancer is in remission. He tells me his legs have less edema because he has been walking more. His hands are numb, and he must look at what he is picking up to keep form dropping it.   Opthalmology in to bedside, and meeting was  ended. Will continue to follow .     SUMMARY OF RECOMMENDATIONS   Palliative will continue to follow. Continue current tx.   Prognosis:   Unable to determine  Discharge Planning: To Be Determined      Primary Diagnoses: Present on Admission: . Large pleural effusion   I have reviewed the medical record, interviewed the patient and family, and examined the patient. The following aspects are pertinent.  Past Medical History:  Diagnosis Date  . Multiple myeloma (HCC)    Social History   Socioeconomic History  . Marital status: Married    Spouse name: Not on file  . Number of children: Not on file  . Years of education: Not on file  . Highest education level: Not on file  Occupational History  . Not on file  Social Needs  . Financial resource strain: Not on file  . Food insecurity    Worry: Not on file    Inability: Not on  file  . Transportation needs    Medical: Not on file    Non-medical: Not on file  Tobacco Use  . Smoking status: Former Research scientist (life sciences)  . Smokeless tobacco: Never Used  Substance and Sexual Activity  . Alcohol use: Not on file  . Drug use: Not on file  . Sexual activity: Not on file  Lifestyle  . Physical activity    Days per week: Not on file    Minutes per session: Not on file  . Stress: Not on file  Relationships  . Social Herbalist on phone: Not on file    Gets together: Not on file    Attends religious service: Not on file    Active member of club or organization: Not on file    Attends meetings of clubs or organizations: Not on file    Relationship status: Not on file  Other Topics Concern  . Not on file  Social History Narrative  . Not on file   History reviewed. No pertinent family history. Scheduled Meds: . docusate sodium  100 mg Oral BID  . moxifloxacin  1 drop Left Eye 6 X Daily  . potassium chloride  40 mEq Oral Once  . Tdap  0.5 mL Intramuscular Once   Continuous Infusions: . sodium chloride 50 mL/hr at  07/11/19 0625   PRN Meds:.acetaminophen **OR** acetaminophen, bisacodyl, HYDROcodone-acetaminophen, ondansetron **OR** ondansetron (ZOFRAN) IV, traZODone Medications Prior to Admission:  Prior to Admission medications   Medication Sig Start Date End Date Taking? Authorizing Provider  acyclovir (ZOVIRAX) 400 MG tablet Take 400 mg by mouth 2 (two) times a day.   Yes [provider]  atorvastatin (LIPITOR) 80 MG tablet Take 40 mg by mouth at bedtime.   Yes [provider]  ferrous sulfate 325 (65 FE) MG tablet Take 325 mg by mouth daily.   Yes [provider]  finasteride (PROSCAR) 5 MG tablet Take 5 mg by mouth every evening.    Yes [provider]  pantoprazole (PROTONIX) 40 MG tablet Take 40 mg by mouth daily.   Yes [provider]  potassium chloride SA (K-DUR) 20 MEQ tablet Take 20 mEq by mouth 2 (two) times a day.   Yes [provider]  terazosin (HYTRIN) 5 MG capsule Take 5 mg by mouth 2 (two) times daily.    Yes [provider]  torsemide (DEMADEX) 20 MG tablet Take 20 mg by mouth daily.  12/04/18 12/04/19 Yes [provider]  vitamin B-12 (CYANOCOBALAMIN) 1000 MCG tablet Take 1,000 mcg by mouth daily.   Yes [provider]   Allergies  Allergen Reactions  . Benazepril Other (See Comments)  . Lemon Oil Other (See Comments)  . Lime, Sulfurated Other (See Comments)   Review of Systems  Neurological:       Numbness in hands.     Physical Exam Eyes:     Comments: Shield to left eye.   Pulmonary:     Effort: Pulmonary effort is normal.  Neurological:     Mental Status: He is alert.     Vital Signs: BP 135/76 (BP Location: Left Arm)   Pulse 86   Temp 98.5 F (36.9 C)   Resp 19   Ht 6' (1.829 m)   Wt 99.3 kg   SpO2 100%   BMI 29.70 kg/m  Pain Scale: 0-10   Pain Score: 0-No pain   SpO2: SpO2: 100 % O2 Device:SpO2: 100 % O2 Flow Rate: .O2  Flow Rate (L/min): 3 L/min  IO: Intake/output  summary:   Intake/Output Summary (Last 24 hours) at 07/11/2019 1217 Last data filed at 07/11/2019 0900 Gross per 24 hour  Intake 1375.31 ml  Output -  Net 1375.31 ml    LBM: Last BM Date: 07/09/19 Baseline Weight: Weight: 99.3 kg Most recent weight: Weight: 99.3 kg     Palliative Assessment/Data:   50% at best     Time In: 11:55 Time Out: 12:30 Time Total: 35 min Greater than 50%  of this time was spent counseling and coordinating care related to the above assessment and plan.  Signed by: Asencion Gowda, NP   Please contact Palliative Medicine Team phone at [phone] for questions and concerns.  For individual provider: See Shea Evans

## 2019-07-11 NOTE — Progress Notes (Signed)
*  PRELIMINARY RESULTS* Echocardiogram 2D Echocardiogram has been performed.  Sherrie Sport 07/11/2019, 8:39 AM

## 2019-07-11 NOTE — Progress Notes (Addendum)
Initial Nutrition Assessment  RD working remotely.  DOCUMENTATION CODES:   Not applicable  INTERVENTION:   - Recommend liberalizing diet to Regular  - Ensure Enlive po BID, each supplement provides 350 kcal and 20 grams of protein (vanilla or chocolate flavor)  NUTRITION DIAGNOSIS:   Increased nutrient needs related to cancer and cancer related treatments (multiple myeloma) as evidenced by estimated needs.  GOAL:   Patient will meet greater than or equal to 90% of their needs  MONITOR:   PO intake, Supplement acceptance, Labs, I & O's, Weight trends  REASON FOR ASSESSMENT:   Malnutrition Screening Tool    ASSESSMENT:   78 year old male who presented to the ED on 7/15 with a forehead and left eye laceration after rolling out of bed. PMH of multiple myeloma, recurrent right pleural effusion, GERD, HTN. Chest x-ray revealing large right pleural effusion and concern for bilateral insterstitial pulmonary opacities.  7/15 - s/p right-sided thoracentesis with 1 L of fluid removed, s/p repair of corneal laceration with repositioning of uveal tissue  Spoke with pt via phone call to room. Pt in good spirits, states he is feeling much better today.  Pt reports having a good appetite. Pt states that today, he has eaten well. Pt reports that PTA he eats 3 meals daily and has a "snack" after each meal (mixed fruit or jello or ice cream). Pt reports no issues with his appetite or PO intake PTA.  Pt states that his weight "goes up and down." Pt reports that a usual weight for him is 219 lbs. No weight history available in chart. Weight on admission of 219 appears stated rather than measured.  Pt denies any N/V and states he has regular bowel movements. Pt states that prior to the thoracentesis, he was having a lot of coughing when eating but that this has seemed to resolve. RD instructed pt to alert nursing if he feels like he is choking on his food.  Pt reports that he used to drink  oral nutrition supplements but has not been drinking them recently. Pt is amenable to receiving a supplement during admission and prefers vanilla or strawberry flavors.  Reviewed RN edema assessment. Pt with mild pitting edema to BLE.  Meal Completion: 50-100% x 2 meals  Medications reviewed and include: Colace, lasix 40 mg BID IVF: NS @ 50 ml/hr  Labs reviewed: sodium 146, potasium 3.4, hemoglobin 9.4  NUTRITION - FOCUSED PHYSICAL EXAM:  Unable to complete at this time. RD working remotely.  Diet Order:   Diet Order            Diet Heart Room service appropriate? Yes; Fluid consistency: Thin  Diet effective now              EDUCATION NEEDS:   Education needs have been addressed  Skin:  Skin Assessment: Skin Integrity Issues: Incisions: left eye  Last BM:  07/09/19  Height:   Ht Readings from Last 1 Encounters:  07/10/19 6' (1.829 m)    Weight:   Wt Readings from Last 1 Encounters:  07/10/19 99.3 kg    Ideal Body Weight:  80.9 kg  BMI:  Body mass index is 29.7 kg/m.  Estimated Nutritional Needs:   Kcal:  2100-2300  Protein:  105-120 grams  Fluid:  >/= 2.0 L    Gaynell Face, MS, RD, LDN Inpatient Clinical Dietitian Pager: [phone] Weekend/After Hours: [phone]

## 2019-07-11 NOTE — Progress Notes (Signed)
*  PRELIMINARY RESULTS* Echocardiogram 2D Echocardiogram has been performed.  Dan Miranda 07/11/2019, 8:35 AM

## 2019-07-11 NOTE — Anesthesia Postprocedure Evaluation (Signed)
Anesthesia Post Note  Patient: [name].  Procedure(s) Performed: REPAIR OF RUPTURED GLOBE (Left Eye)  Patient location during evaluation: PACU Anesthesia Type: General Level of consciousness: awake and alert and oriented Pain management: pain level controlled Vital Signs Assessment: post-procedure vital signs reviewed and stable Respiratory status: spontaneous breathing Cardiovascular status: blood pressure returned to baseline Anesthetic complications: no     Last Vitals:  Vitals:   07/10/19 2207 07/11/19 0424  BP: 137/84 135/76  Pulse: 92 86  Resp: 16 19  Temp: (!) 36.4 C 36.9 C  SpO2: 99% 100%    Last Pain:  Vitals:   07/10/19 2207  TempSrc:   PainSc: 0-No pain                 Sandro Burgo

## 2019-07-11 NOTE — Progress Notes (Signed)
[facility] [place] NAME: Dan Miranda    MR#:  [id number]  DATE OF BIRTH:  February 19, 1941  SUBJECTIVE:  CHIEF COMPLAINT:   Chief Complaint  Patient presents with   Laceration  Feels much better REVIEW OF SYSTEMS:  Review of Systems  Constitutional: Negative for diaphoresis, fever, malaise/fatigue and weight loss.  HENT: Negative for ear discharge, ear pain, hearing loss, nosebleeds, sore throat and tinnitus.   Eyes: Positive for blurred vision. Negative for pain.  Respiratory: Negative for cough, hemoptysis, shortness of breath and wheezing.   Cardiovascular: Negative for chest pain, palpitations, orthopnea and leg swelling.  Gastrointestinal: Negative for abdominal pain, blood in stool, constipation, diarrhea, heartburn, nausea and vomiting.  Genitourinary: Negative for dysuria, frequency and urgency.  Musculoskeletal: Positive for falls. Negative for back pain and myalgias.  Skin: Negative for itching and rash.  Neurological: Negative for dizziness, tingling, tremors, focal weakness, seizures, weakness and headaches.  Psychiatric/Behavioral: Negative for depression. The patient is not nervous/anxious.    DRUG ALLERGIES:   Allergies  Allergen Reactions   Benazepril Other (See Comments)   Lemon Oil Other (See Comments)   Lime, Sulfurated Other (See Comments)   VITALS:  Blood pressure 135/76, pulse 86, temperature 98.5 F (36.9 C), resp. rate 19, height 6' (1.829 m), weight 99.3 kg, SpO2 100 %. PHYSICAL EXAMINATION:  Physical Exam HENT:     Head: Normocephalic and atraumatic.  Eyes:     Conjunctiva/sclera: Conjunctivae normal.     Pupils: Pupils are equal, round, and reactive to light.     Comments: Left eye is covered with patch  Neck:     Musculoskeletal: Normal range of motion and neck supple.     Thyroid: No thyromegaly.     Trachea: No tracheal deviation.  Cardiovascular:     Rate and Rhythm: Normal rate and regular  rhythm.     Heart sounds: Normal heart sounds.  Pulmonary:     Effort: Pulmonary effort is normal. No respiratory distress.     Breath sounds: Normal breath sounds. No wheezing.  Chest:     Chest wall: No tenderness.  Abdominal:     General: Bowel sounds are normal. There is no distension.     Palpations: Abdomen is soft.     Tenderness: There is no abdominal tenderness.  Musculoskeletal: Normal range of motion.  Skin:    General: Skin is warm and dry.     Findings: No rash.  Neurological:     Mental Status: He is alert and oriented to person, place, and time.     Cranial Nerves: No cranial nerve deficit.    LABORATORY PANEL:  Male CBC Recent Labs  Lab 07/11/19 0441  WBC 3.6*  HGB 9.4*  HCT 31.7*  PLT 194   ------------------------------------------------------------------------------------------------------------------ Chemistries  Recent Labs  Lab 07/10/19 0835 07/11/19 0441  NA 145 146*  K 3.3* 3.4*  CL 111 111  CO2 26 27  GLUCOSE 101* 132*  BUN 10 12  CREATININE 0.80 0.83  CALCIUM 8.5* 8.3*  AST 18  --   ALT 14  --   ALKPHOS 98  --   BILITOT 2.5*  --    RADIOLOGY:  Ct Chest Wo Contrast  Result Date: 07/10/2019 CLINICAL DATA:  Pt arrives to ED via [facility] from home with c/o forehead and left eye lacerations s/p rolling out of bed PTA. Pt denies any LOC; pt reports some neck pain but denies upper or lower back  pain. Pt denies use of anticoagulants EXAM: CT CHEST WITHOUT CONTRAST TECHNIQUE: Multidetector CT imaging of the chest was performed following the standard protocol without IV contrast. COMPARISON:  Current chest radiograph. FINDINGS: Cardiovascular: Heart is top-normal in size. There is a small pericardial effusion. Dense three-vessel coronary artery calcifications. Mild enlargement of main pulmonary artery to 3.8 cm. Normal caliber abdominal aorta. Aortic and arch vessel atherosclerotic calcifications. Mediastinum/Nodes: No neck base or axillary masses or  pathologically enlarged lymph nodes. Thyroid is normal in size. There are scattered prominent mediastinal lymph nodes, largest a precarinal node measuring 1.5 cm in short axis. No mediastinal masses. No hilar masses or defined enlarged lymph nodes. Lungs/Pleura: Large right pleural effusion and small left pleural effusion. There are pleural based calcified plaques most evident along the hemidiaphragm surfaces. The right lower lobe is mostly collapsed. There is partial collapse of the right middle lobe. Irregular linear opacities are noted in the right upper lobe, consistent with atelectasis or scarring, along with mild interstitial thickening. Minimal atelectasis in the dependent left lower lobe. There is irregular opacity in the left upper lobe that may reflect atelectasis or scarring. No lung mass or suspicious nodule. There is a questionable partial filling defect within lower lobe segmental bronchi centrally. No pneumothorax. Upper Abdomen: No acute findings. Small low-density lesion at the dome of the liver consistent with a cyst. Musculoskeletal: No fracture or acute finding. Degenerative changes noted throughout the visualized spine as well as bridging anterior osteophytes suggesting DISH. No osteoblastic or osteolytic lesions. IMPRESSION: 1. Large right and small left pleural effusions. 2. Near complete atelectasis of the right lower lobe partial atelectasis of the right upper lobe. Questionable partial filling defect and a central lower lobe bronchus. Consider follow-up bronchoscopy. 3. Other areas of lung opacity are consistent with scarring or additional atelectasis. No convincing pneumonia. 4. 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 5. Small pericardial effusion. Dense three-vessel coronary artery calcifications. 6. Aortic atherosclerosis Aortic Atherosclerosis (ICD10-I70.0). Electronically Signed   By: Lajean Manes M.D.   On: 07/10/2019 13:18   Dg Chest Port 1  View  Result Date: 07/10/2019 CLINICAL DATA:  Post right-sided thoracentesis EXAM: PORTABLE CHEST 1 VIEW COMPARISON:  07/10/2019; chest CT-07/10/2019 FINDINGS: Interval reduction/resolution of right-sided pleural effusion post thoracentesis with development of a ex vacuo pneumothorax. No associated mediastinal shift. Minimally improved aeration of the right perihilar lung with persistent atelectasis/collapse of the right upper and middle lobes as demonstrated on preceding chest CT. There is unchanged mild diffuse slightly nodular thickening of the pulmonary interstitium. No evidence of edema. No left-sided pleural effusion or pneumothorax. No acute osseous abnormalities. IMPRESSION: 1. Interval reduction/resolution of right-sided pleural effusion post thoracentesis with development of an ex vacuo pneumothorax without associated mediastinal shift, an expected finding given dense consolidation/collapse of the right middle and lower lobes. 2. Minimally improved aeration the right perihilar lung with persistent atelectasis collapse of the right upper and middle lobes as demonstrated on preceding chest CT. Above findings, specifically that the ex vacuo pneumothorax does not require chest tube placement, was discussed with the patient's hospitalist, Dr. Manuella Ghazi. Electronically Signed   By: Sandi Mariscal M.D.   On: 07/10/2019 17:04   US Thoracentesis Asp Pleural Space W/img Guide  Result Date: 07/10/2019 INDICATION: Symptomatic right-sided pleural effusion. Please perform ultrasound-guided thoracentesis prior to patient's urgent ocular surgery. EXAM: US THORACENTESIS ASP PLEURAL SPACE W/IMG GUIDE COMPARISON:  Chest radiograph-earlier same day; chest CT-earlier same day MEDICATIONS: None. COMPLICATIONS: SIR Level A -  No therapy, no consequence. Procedure complicated by development of an asymptomatic ex vacuo pneumothorax, an expected finding given dense consolidation of the right middle and lobes. TECHNIQUE: Informed  written consent was obtained from the patient after a discussion of the risks, benefits and alternatives to treatment. A timeout was performed prior to the initiation of the procedure. Initial ultrasound scanning demonstrates a large minimally complex right-sided pleural effusion. The lower chest was prepped and draped in the usual sterile fashion. 1% lidocaine was used for local anesthesia. An ultrasound image was saved for documentation purposes. An 8 Fr Safe-T-Centesis catheter was introduced. The thoracentesis was performed. The catheter was removed and a dressing was applied. The patient tolerated the procedure well without immediate post procedural complication. The patient was escorted to have an upright chest radiograph. FINDINGS: A total of approximately 1 liter of serous fluid was removed. Requested samples were sent to the laboratory. IMPRESSION: 1. Successful ultrasound-guided right sided thoracentesis yielding 1 liter of pleural fluid. 2. Procedure complicated by development of an asymptomatic ex vacuo pneumothorax, an expected finding given dense consolidation of the right middle and lobes. Electronically Signed   By: Sandi Mariscal M.D.   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 - Vigamox eye drops 6x/day left eye. - eye shield in place. Do not manipulate the eye, no pressure on the eye.  * Hypokalemia: replete and recheck  * Fall : likely multifactorial. PT, OT c/s     All the records are reviewed and case discussed with Care Management/Social Worker. Management plans discussed with the patient, nursing and they are in  agreement.  CODE STATUS: Full Code  TOTAL TIME TAKING CARE OF THIS PATIENT: 35 minutes.   More than 50% of the time was spent in counseling/coordination of care: YES  POSSIBLE D/C IN 1 DAYS, DEPENDING ON CLINICAL CONDITION.   Max Sane M.D on 07/11/2019 at 11:42 AM  Between 7am to 6pm - Pager - [phone]  After 6pm go to [email or web address] - Proofreader  [facility]  Office  [phone]  CC: Primary care physician; Patient, No Pcp Per  Note: This dictation was prepared with Dragon dictation along with smaller phrase technology. Any transcriptional errors that result from this process are unintentional.

## 2019-07-11 NOTE — Consult Note (Signed)
Followup consult note.   Postop day 1 from corneal laceration and repositing of iris tissue, left eye.  Patient denies pain.  He has kept his patch and shield on since the surgery.    Mental status: Alert and Oriented x 4, pleasant, interactive.  Visual Acuity:  20/70 OD  Count fingers near   Pupils:  Right is round, reactive to light.  Left is irregular, unreactive. Motility:  Full/ orthophoric  IOP:  Right is 14 mm, Left is 24 mm by tonopen.  External/ Lids/ Lashes:  Mild mattering.  No lagophthalmos. Full normal blink.  Anterior Segment:  Conjunctiva:  Left has subconjunctival hemorrhage. 50%  Cornea:  Few heme clots on endothelium, mostly clear.   Single 10-0- nylon suture in place through temporal wound.  Seidel negative.  Anterior Chamber: Portable slit lamp, mild haze.    Lens:   IOL in good position.   Posterior exam with 20D lens, poor view, hazy, but visible portions of retina are flat.   Assessment/Plan: POD1 s/p corneal laceration repair, doing well.  Ok to discontinue use of shield and patch.  1.  Vigamox eye drops 6x/day. 2.  Durezol eye drops 6x/day.  Please provide drops to patient upon discharge to continue using until followup with ophthalmology outpatient. 3.  OTC Artificial tears, gel or ointment ok to use prn, irritation. 4.  Resume home glaucoma eye drops upon return to home.   5.  Followup with ophthalmology, Dr. Benay Pillow [facility] Tuesday 7/21 8:30 am.  Patient given card.  Ok to discharge to home from ophtho standpoint with outpatient followup.   Benay Pillow 07/11/2019, 1:06 PM

## 2019-07-11 NOTE — Evaluation (Signed)
Physical Therapy Evaluation Patient Details Name: Dan Miranda. MRN: [id number] DOB: [birth year] Today's Date: 07/11/2019   History of Present Illness  78 y.o. male with a PMHx including multiple myeloma, CHF, PVD, HTN, GERD, glaucoma, and recent extended hospital stay at [facility] for viral PNA. Pt presents to ED 2/2 mechanical fall with resulting left eye laceration. S/p thoracentesis of R lung on 07/10/19 and surgical repair of L eye injury on 07/10/19.  Clinical Impression  Upon evaluation, patient alert and oriented; follows commands and demonstrates good effort with all mobility tasks. Eager for OOB activities and gait trials, voicing desire to maintain strength and mobility status during hospital stay.  Bilat UE/LE strength and ROM grossly symmetrical and WFL; no focal weakness, sensory deficit noted. Significant generalized edema throughout bilat LEs noted.  Able to complete sit/stand, basic transfers and gait (200' x2) without assist device (cga/close sup) and with RW (close sup). Optimal safety, indep and overall energy conservation noted with use of RW; do recommend continued use at this time. Mild SOB noted with exertion (BORG 5/10 per patient report); requiring 3L supplemental O2 via [place] to maintain sats >91% with activity. Would benefit from skilled PT to address above deficits and promote optimal return to PLOF; Recommend transition to [place] upon discharge from acute hospitalization.  SaO2 on room air at rest = 86% SaO2 on 2L O2 at rest = 96% SaO2 on 3 liters of O2 while ambulating = 94%     Follow Up Recommendations Home health PT    Equipment Recommendations       Recommendations for Other Services       Precautions / Restrictions Precautions Precautions: Fall Precaution Comments: L eye shield (s/p surgical repair) Restrictions Weight Bearing Restrictions: No      Mobility  Bed Mobility               General bed mobility comments: seated edge of bed  beginning/end of treatment session  Transfers Overall transfer level: Needs assistance Equipment used: Rolling walker (2 wheeled) Transfers: Sit to/from Stand Sit to Stand: Supervision;Min guard         General transfer comment: steady, no LOB; good comfort/confidence with movement transition  Ambulation/Gait Ambulation/Gait assistance: Min guard Gait Distance (Feet): 200 Feet Assistive device: None       General Gait Details: reciprocal stepping pattern, good trunk rotation, mildly excessve arm swing; slight sway with dynamic gait components. Increased SOB/energy expenditure without assist device  Stairs            Wheelchair Mobility    Modified Rankin (Stroke Patients Only)       Balance Overall balance assessment: Needs assistance Sitting-balance support: No upper extremity supported;Feet supported Sitting balance-Leahy Scale: Good     Standing balance support: No upper extremity supported Standing balance-Leahy Scale: Fair                               Pertinent Vitals/Pain Pain Assessment: No/denies pain    Home Living Family/patient expects to be discharged to:: Private residence Living Arrangements: Spouse/significant other Available Help at Discharge: Family;Available 24 hours/day Type of Home: House       Home Layout: Two level;Able to live on main level with bedroom/bathroom Home Equipment: Kasandra Knudsen - single point;Walker - 2 wheels Additional Comments: has equipment from previous rehab stay after hospitalization    Prior Function Level of Independence: Independent  Comments: Pt since recovered from rehab, ambulating without AD, driving short distances, and idnep with ADL. Pt reports increasing SOB and fatigue with daily activity prior to this admission. No home O2. No other falls.     Hand Dominance        Extremity/Trunk Assessment   Upper Extremity Assessment Upper Extremity Assessment: Overall WFL for tasks  assessed    Lower Extremity Assessment Lower Extremity Assessment: Overall WFL for tasks assessed(grossly edematous throughout)       Communication   Communication: No difficulties  Cognition Arousal/Alertness: Awake/alert Behavior During Therapy: WFL for tasks assessed/performed Overall Cognitive Status: Within Functional Limits for tasks assessed                                        General Comments      Exercises Other Exercises Other Exercises: 200' with RW, close sup-improved fluidity, comfort and confidence with use of RW; improved oxygenation, decreased SOB.  Does require O2 at 3L [place] to maintain sats >91% with exertion.   Assessment/Plan    PT Assessment Patient needs continued PT services  PT Problem List Decreased activity tolerance;Decreased mobility;Decreased balance;Cardiopulmonary status limiting activity       PT Treatment Interventions DME instruction;Functional mobility training;Balance training;Patient/family education;Gait training;Therapeutic activities;Stair training;Therapeutic exercise    PT Goals (Current goals can be found in the Care Plan section)  Acute Rehab PT Goals Patient Stated Goal: to go home PT Goal Formulation: With patient Time For Goal Achievement: 07/25/19 Potential to Achieve Goals: Good    Frequency Min 2X/week   Barriers to discharge        Co-evaluation               AM-PAC PT "6 Clicks" Mobility  Outcome Measure Help needed turning from your back to your side while in a flat bed without using bedrails?: None Help needed moving from lying on your back to sitting on the side of a flat bed without using bedrails?: None Help needed moving to and from a bed to a chair (including a wheelchair)?: None Help needed standing up from a chair using your arms (e.g., wheelchair or bedside chair)?: None Help needed to walk in hospital room?: A Little Help needed climbing 3-5 steps with a railing? : A Little 6  Click Score: 22    End of Session Equipment Utilized During Treatment: Gait belt;Oxygen Activity Tolerance: Patient tolerated treatment well Patient left: in bed;with call bell/phone within reach;with bed alarm set Nurse Communication: Mobility status PT Visit Diagnosis: Muscle weakness (generalized) (M62.81);Difficulty in walking, not elsewhere classified (R26.2)    Time: 8168-3870 PT Time Calculation (min) (ACUTE ONLY): 20 min   Charges:   PT Evaluation $PT Eval Moderate Complexity: 1 Mod PT Treatments $Gait Training: 8-22 mins       Maryfer Tauzin H. Owens Shark, PT, DPT, NCS 07/11/19, 10:54 PM [phone]

## 2019-07-11 NOTE — TOC Initial Note (Signed)
Transition of Care (TOC) - Initial/Assessment Note    Patient Details  Name: Dan Miranda. MRN: [id number] Date of Birth: June 16, 1941  Transition of Care [facility]) CM/SW Contact:    Shelbie Hutching, RN Phone Number: 07/11/2019, 11:48 AM  Clinical Narrative:                 Patient admitted after a fall at home with an eye laceration, left eye corneal laceration that required surgical repair.  Patient was also noted to have a pleural effusion that required a thoracentesis.  Patient reports that he is feeling better sitting up on the side of the bed, acute O2 at 2L.  Patient is a patient of the [facility] and receives primary care services and his medications from them.  OT has been in to evaluate patient and recommends home health OT, patient reports that he has had home health in the past and he knows what exercises should be done.  At this time patient refuses home health services.   Patient lives with his wife, he reports he still drives, he has a walker and cane at home.  No discharge needs identified at this time.   Expected Discharge Plan: Home/Self Care Barriers to Discharge: Continued Medical Work up   Patient Goals and CMS Choice Patient states their goals for this hospitalization and ongoing recovery are:: Wants to be able to breath better and do his exercises at home.      Expected Discharge Plan and Services Expected Discharge Plan: Home/Self Care   Discharge Planning Services: CM Consult   Living arrangements for the past 2 months: Single Family Home Expected Discharge Date: 07/15/19                                    Prior Living Arrangements/Services Living arrangements for the past 2 months: Single Family Home Lives with:: Spouse Patient language and need for interpreter reviewed:: No Do you feel safe going back to the place where you live?: Yes      Need for Family Participation in Patient Care: Yes (Comment)(cancer and has been weak) Care giver support  system in place?: Yes (comment)(wife) Current home services: DME(cane and walker) Criminal Activity/Legal Involvement Pertinent to Current Situation/Hospitalization: No - Comment as needed  Activities of Daily Living Home Assistive Devices/Equipment: Gilford Rile (specify type), Grab bars in shower, Grab bars around toilet, Cane (specify quad or straight) ADL Screening (condition at time of admission) Patient's cognitive ability adequate to safely complete daily activities?: Yes Is the patient deaf or have difficulty hearing?: No Does the patient have difficulty seeing, even when wearing glasses/contacts?: No Does the patient have difficulty concentrating, remembering, or making decisions?: No Patient able to express need for assistance with ADLs?: Yes Does the patient have difficulty dressing or bathing?: No Independently performs ADLs?: Yes (appropriate for developmental age) Does the patient have difficulty walking or climbing stairs?: Yes Weakness of Legs: None Weakness of Arms/Hands: None  Permission Sought/Granted Permission sought to share information with : Case Manager, Family Supports Permission granted to share information with : Yes, Verbal Permission Granted        Permission granted to share info w Relationship: Wife     Emotional Assessment Appearance:: Appears stated age Attitude/Demeanor/Rapport: Engaged Affect (typically observed): Accepting Orientation: : Oriented to Self, Oriented to Place, Oriented to  Time, Oriented to Situation Alcohol / Substance Use: Not Applicable Psych Involvement: No (comment)  Admission diagnosis:  Shortness of breath [R06.02] Status post thoracentesis [Z98.890] Eye injury, initial encounter [S05.90XA] Patient Active Problem List   Diagnosis Date Noted  . Large pleural effusion 07/10/2019   PCP:  Patient, No Pcp Per Pharmacy:   [facility], [address] [place] [address] Phone:  [phone] Fax: [phone]     Social Determinants of Health (SDOH) Interventions    Readmission Risk Interventions No flowsheet data found.

## 2019-07-11 NOTE — Consult Note (Signed)
[facility] Miranda [facility] Consultation      Assessment and Plan:  Recurrent right pleural effusion. Right-sided lung entrapment, status post right thoracentesis. History of multiple myeloma, PJP pneumonia. - The patient is feeling significantly better since the thoracentesis, and is ready to go home.  Discussed with him that his right-sided lung collapse may be a chronic condition, and may require further intervention such as chest tube, bronchoscopy, thoracotomy.  He gets most of his care  at the [facility] in [place].  Therefore I strongly recommended that he follow-up there as soon as possible after discharge.  I explained that the pleural effusion is likely to recur and will likely require further interventions.  In the meantime I am going to order a chest x-ray in 1 week and have him follow-up with me to see if the fluid needs to be drained again.   Date: 07/11/2019  MRN# [id number] Dan Miranda. 25-Jun-1941  Referring Physician: Dr. Max Miranda for ILD.   Dan Miranda. is a 78 y.o. old male seen in consultation for chief complaint of:    Chief Complaint  Patient presents with   Laceration    HPI:  Dan Miranda. is a 78 y.o. old male with a history of multiple myeloma, hypertension, chronic venous insufficiency, GERD, open-angle glaucoma presented to the ED after a fall with a laceration of his forehead, which occurred when he rolled out of bed.  He had blurry vision in the left eye with left eye prolapse and pain.  Yesterday (7/15) the patient underwent repair of corneal laceration with re-posting of uveal tissue of the left eye.  Patient was also noted to have a large right-sided pleural effusion, he underwent a right-sided thoracentesis by interventional radiology on 7/15 with removal of 1 L of serous pleural fluid.  He has a significant history of PJP pneumonia which occurred when the patient was on chronic steroids for multiple myeloma, admitted for about 2 months in late 2019 at  the [facility], summarized below.  At the current time, now that he thinks about it he thinks that his shortness of breath have been progressing for about the last 3 to 4 weeks, until he presented to the hospital and noticed that he had moderate shortness of breath.  After his drainage he noticed that his breathing had normalized. Currently the patient is feeling like his usual self, he has no new complaints, he is being planned for discharge likely tomorrow.   He had a lengthy admission at [facility] on 09/28/2018 and treated for P JP pneumonia, He was treated inpatient with Bactrim (10/22-28), transitioned to clindamycin and primaquine (10/28-11/11) due to AKI presumed 2/2 Bactrim. He was ultimately placed back on Bactrim for ppx per the recommendations of ID. He was discharged on Bactrim 800/160 MWF as well as prophylactic acyclovir. He had notably been receiving chronic dexamethasone for the preceding 18 months as part of his multiple myeloma treatment, and had not been on PJP prophylaxis at that time.  discharged on 11/27/2018. Then admitted to [facility] SN[facility] on 11/30/2018 for 3 days for hypoglycemia and altered mental status.  At that time he was also noted to have a large right-sided pleural effusion, underwent thoracentesis on 11/30/2018 with 1 L removed, thought to be secondary to heart failure.  He was discharged with torsemide 40 mg daily with potassium and continued prophylaxis of Bactrim 800/160 MWF.  Chest x-ray and CT chest 07/10/2019>> imaging personally reviewed, there is right-sided  pneumothorax, the right lung appears to be trapped, and is not reexpanded after drainage of pleural fluid. Review of CT chest shows no obvious mass, though there is an area in the RLL bronchus which could be tumor vs mucus plug.   PMHX:   Past Medical History:  Diagnosis Date   Multiple myeloma ([place])    Surgical Hx:  Past Surgical History:  Procedure Laterality Date   APPENDECTOMY       CHOLECYSTECTOMY     HERNIA REPAIR     RUPTURED GLOBE EXPLORATION AND REPAIR Left 07/10/2019   Procedure: REPAIR OF RUPTURED GLOBE;  Surgeon: Dan Bear, MD;  Location: [facility] ORS;  Service: Ophthalmology;  Laterality: Left;   Family Hx:  History reviewed. No pertinent family history. Social Hx:   Social History   Tobacco Use   Smoking status: Former Smoker   Smokeless tobacco: Never Used  Substance Use Topics   Alcohol use: Not on file   Drug use: Not on file   Medication:    Current Facility-Administered Medications:    0.9 %  sodium chloride infusion, , Intravenous, Continuous, Dan Sane, MD, Last Rate: 50 mL/hr at 07/11/19 1610   acetaminophen (TYLENOL) tablet 650 mg, 650 mg, Oral, Q6H PRN **OR** acetaminophen (TYLENOL) suppository 650 mg, 650 mg, Rectal, Q6H PRN, Dan Sane, MD   bisacodyl (DULCOLAX) EC tablet 5 mg, 5 mg, Oral, Daily PRN, Dan Miranda, Vipul, MD   docusate sodium (COLACE) capsule 100 mg, 100 mg, Oral, BID, Dan Miranda, Vipul, MD, 100 mg at 07/10/19 2348   HYDROcodone-acetaminophen (NORCO/VICODIN) 5-325 MG per tablet 1-2 tablet, 1-2 tablet, Oral, Q4H PRN, Dan Miranda, Vipul, MD   moxifloxacin (VIGAMOX) 0.5 % ophthalmic solution 1 drop, 1 drop, Left Eye, 6 X Daily, Dan Bear, MD, 1 drop at 07/11/19 0634   ondansetron (ZOFRAN) tablet 4 mg, 4 mg, Oral, Q6H PRN **OR** ondansetron (ZOFRAN) injection 4 mg, 4 mg, Intravenous, Q6H PRN, Dan Miranda, Vipul, MD   Tdap (BOOSTRIX) injection 0.5 mL, 0.5 mL, Intramuscular, Once, Dan [place], MD   traZODone (DESYREL) tablet 25 mg, 25 mg, Oral, QHS PRN, Dan Sane, MD   Allergies:  Benazepril; Lemon oil; and Lime, sulfurated  Review of Systems: Gen:  Denies  fever, sweats, chills HEENT: Denies blurred vision, double vision. bleeds, sore throat Cvc:  No dizziness, chest pain. Resp:   Denies cough or sputum production, shortness of breath Gi: Denies swallowing difficulty, stomach pain. Gu:  Denies bladder  incontinence, burning urine Ext:   No Joint pain, stiffness. Skin: No skin rash,  hives  Endoc:  No polyuria, polydipsia. Psych: No depression, insomnia. Other:  All other systems were reviewed with the patient and were negative other that what is mentioned in the HPI.   Physical Examination:   VS: BP 135/76 (BP Location: Left Arm)    Pulse 86    Temp 98.5 F (36.9 C)    Resp 19    Ht 6' (1.829 m)    Wt 99.3 kg    SpO2 100%    BMI 29.70 kg/m   General Appearance: No distress  Neuro:without focal findings,  speech normal,  HEENT: PERRLA, EOM intact.   Pulmonary: normal breath sounds, No wheezing.  Decreased air entry in the right lung. CardiovascularNormal S1,S2.  No m/r/g.   Abdomen: Benign, Soft, non-tender. Renal:  No costovertebral tenderness  GU:  No performed at this time. Endoc: No evident thyromegaly, no signs of acromegaly. Skin:   warm, no rashes, no ecchymosis  Extremities:  normal, no cyanosis, clubbing.  Other findings:    LABORATORY PANEL:   CBC Recent Labs  Lab 07/11/19 0441  WBC 3.6*  HGB 9.4*  HCT 31.7*  PLT 194   ------------------------------------------------------------------------------------------------------------------  Chemistries  Recent Labs  Lab 07/10/19 0835 07/11/19 0441  NA 145 146*  K 3.3* 3.4*  CL 111 111  CO2 26 27  GLUCOSE 101* 132*  BUN 10 12  CREATININE 0.80 0.83  CALCIUM 8.5* 8.3*  AST 18  --   ALT 14  --   ALKPHOS 98  --   BILITOT 2.5*  --    ------------------------------------------------------------------------------------------------------------------  Cardiac Enzymes No results for input(s): TROPONINI in the last 168 hours. ------------------------------------------------------------  RADIOLOGY:  Ct Head Wo Contrast  Result Date: 07/10/2019 CLINICAL DATA:  Fall from bed with forehead lacerations, initial encounter EXAM: CT HEAD WITHOUT CONTRAST CT MAXILLOFACIAL WITHOUT CONTRAST CT CERVICAL SPINE WITHOUT  CONTRAST TECHNIQUE: Multidetector CT imaging of the head, cervical spine, and maxillofacial structures were performed using the standard protocol without intravenous contrast. Multiplanar CT image reconstructions of the cervical spine and maxillofacial structures were also generated. COMPARISON:  None. FINDINGS: CT HEAD FINDINGS Brain: Mild atrophic changes are noted. No findings to suggest acute hemorrhage, acute infarction or space-occupying mass lesion are noted. Vascular: No hyperdense vessel or unexpected calcification. Skull: Normal. Negative for fracture or focal lesion. Other: None. CT MAXILLOFACIAL FINDINGS Osseous: No acute bony abnormality is noted. Orbits: Orbits and their contents are within normal limits. Sinuses: Paranasal sinuses are well aerated bilaterally. Soft tissues: There is a cystic appearing lesion deep to the right parotid gland in the parapharyngeal space on the right. It measures 4 by 1.4 cm in greatest transverse and AP dimensions. Few tiny calcifications are noted within. A fat plane is noted between it and the deep aspect of the right parotid gland although this may still represent a salivary gland tumor. Other considerations such as nerve sheath tumor or lymphangioma would deserve consideration as well. No other soft tissue abnormality is noted. CT CERVICAL SPINE FINDINGS Alignment: Within normal limits. Skull base and vertebrae: 7 cervical segments are well visualized. Vertebral body height is well maintained. Disc space narrowing is noted from C3 to C7. Facet hypertrophic changes are noted. No acute fracture or acute facet abnormality is noted. The odontoid is within normal limits. Soft tissues and spinal canal: Surrounding soft tissue structures again demonstrate the previously described ovoid cystic lesion deep to the right parotid gland. No other focal abnormality is noted. Upper chest: Visualized lung apices show evidence of a left-sided pleural effusion but incompletely  evaluated on this exam. Other: None IMPRESSION: CT of the head: Mild atrophic changes without acute intracranial abnormality. CT of the maxillofacial bones: No acute bony abnormality is noted. Ovoid cystic lesion deep to the right parotid gland in the parapharyngeal space as described. Nonemergent MRI may be helpful for further evaluation. CT of the cervical spine: Multilevel degenerative change without acute bony abnormality. Left-sided pleural effusion seen on the last image and incompletely evaluated on this exam. Electronically Signed   By: Inez Catalina M.D.   On: 07/10/2019 07:53   Ct Chest Wo Contrast  Result Date: 07/10/2019 CLINICAL DATA:  Pt arrives to ED via [facility] from home with c/o forehead and left eye lacerations s/p rolling out of bed PTA. Pt denies any LOC; pt reports some neck pain but denies upper or lower back pain. Pt denies use of anticoagulants EXAM: CT CHEST WITHOUT CONTRAST TECHNIQUE: Multidetector CT imaging  of the chest was performed following the standard protocol without IV contrast. COMPARISON:  Current chest radiograph. FINDINGS: Cardiovascular: Heart is top-normal in size. There is a small pericardial effusion. Dense three-vessel coronary artery calcifications. Mild enlargement of main pulmonary artery to 3.8 cm. Normal caliber abdominal aorta. Aortic and arch vessel atherosclerotic calcifications. Mediastinum/Nodes: No neck base or axillary masses or pathologically enlarged lymph nodes. Thyroid is normal in size. There are scattered prominent mediastinal lymph nodes, largest a precarinal node measuring 1.5 cm in short axis. No mediastinal masses. No hilar masses or defined enlarged lymph nodes. Lungs/Pleura: Large right pleural effusion and small left pleural effusion. There are pleural based calcified plaques most evident along the hemidiaphragm surfaces. The right lower lobe is mostly collapsed. There is partial collapse of the right middle lobe. Irregular linear opacities are  noted in the right upper lobe, consistent with atelectasis or scarring, along with mild interstitial thickening. Minimal atelectasis in the dependent left lower lobe. There is irregular opacity in the left upper lobe that may reflect atelectasis or scarring. No lung mass or suspicious nodule. There is a questionable partial filling defect within lower lobe segmental bronchi centrally. No pneumothorax. Upper Abdomen: No acute findings. Small low-density lesion at the dome of the liver consistent with a cyst. Musculoskeletal: No fracture or acute finding. Degenerative changes noted throughout the visualized spine as well as bridging anterior osteophytes suggesting DISH. No osteoblastic or osteolytic lesions. IMPRESSION: 1. Large right and small left pleural effusions. 2. Near complete atelectasis of the right lower lobe partial atelectasis of the right upper lobe. Questionable partial filling defect and a central lower lobe bronchus. Consider follow-up bronchoscopy. 3. Other areas of lung opacity are consistent with scarring or additional atelectasis. No convincing pneumonia. 4. 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 5. Small pericardial effusion. Dense three-vessel coronary artery calcifications. 6. Aortic atherosclerosis Aortic Atherosclerosis (ICD10-I70.0). Electronically Signed   By: Lajean Manes M.D.   On: 07/10/2019 13:18   Ct Cervical Spine Wo Contrast  Result Date: 07/10/2019 CLINICAL DATA:  Fall from bed with forehead lacerations, initial encounter EXAM: CT HEAD WITHOUT CONTRAST CT MAXILLOFACIAL WITHOUT CONTRAST CT CERVICAL SPINE WITHOUT CONTRAST TECHNIQUE: Multidetector CT imaging of the head, cervical spine, and maxillofacial structures were performed using the standard protocol without intravenous contrast. Multiplanar CT image reconstructions of the cervical spine and maxillofacial structures were also generated. COMPARISON:  None. FINDINGS: CT HEAD FINDINGS  Brain: Mild atrophic changes are noted. No findings to suggest acute hemorrhage, acute infarction or space-occupying mass lesion are noted. Vascular: No hyperdense vessel or unexpected calcification. Skull: Normal. Negative for fracture or focal lesion. Other: None. CT MAXILLOFACIAL FINDINGS Osseous: No acute bony abnormality is noted. Orbits: Orbits and their contents are within normal limits. Sinuses: Paranasal sinuses are well aerated bilaterally. Soft tissues: There is a cystic appearing lesion deep to the right parotid gland in the parapharyngeal space on the right. It measures 4 by 1.4 cm in greatest transverse and AP dimensions. Few tiny calcifications are noted within. A fat plane is noted between it and the deep aspect of the right parotid gland although this may still represent a salivary gland tumor. Other considerations such as nerve sheath tumor or lymphangioma would deserve consideration as well. No other soft tissue abnormality is noted. CT CERVICAL SPINE FINDINGS Alignment: Within normal limits. Skull base and vertebrae: 7 cervical segments are well visualized. Vertebral body height is well maintained. Disc space narrowing is noted from C3 to C7. Facet  hypertrophic changes are noted. No acute fracture or acute facet abnormality is noted. The odontoid is within normal limits. Soft tissues and spinal canal: Surrounding soft tissue structures again demonstrate the previously described ovoid cystic lesion deep to the right parotid gland. No other focal abnormality is noted. Upper chest: Visualized lung apices show evidence of a left-sided pleural effusion but incompletely evaluated on this exam. Other: None IMPRESSION: CT of the head: Mild atrophic changes without acute intracranial abnormality. CT of the maxillofacial bones: No acute bony abnormality is noted. Ovoid cystic lesion deep to the right parotid gland in the parapharyngeal space as described. Nonemergent MRI may be helpful for further  evaluation. CT of the cervical spine: Multilevel degenerative change without acute bony abnormality. Left-sided pleural effusion seen on the last image and incompletely evaluated on this exam. Electronically Signed   By: Inez Catalina M.D.   On: 07/10/2019 07:53   Dg Chest Port 1 View  Result Date: 07/10/2019 CLINICAL DATA:  Post right-sided thoracentesis EXAM: PORTABLE CHEST 1 VIEW COMPARISON:  07/10/2019; chest CT-07/10/2019 FINDINGS: Interval reduction/resolution of right-sided pleural effusion post thoracentesis with development of a ex vacuo pneumothorax. No associated mediastinal shift. Minimally improved aeration of the right perihilar lung with persistent atelectasis/collapse of the right upper and middle lobes as demonstrated on preceding chest CT. There is unchanged mild diffuse slightly nodular thickening of the pulmonary interstitium. No evidence of edema. No left-sided pleural effusion or pneumothorax. No acute osseous abnormalities. IMPRESSION: 1. Interval reduction/resolution of right-sided pleural effusion post thoracentesis with development of an ex vacuo pneumothorax without associated mediastinal shift, an expected finding given dense consolidation/collapse of the right middle and lower lobes. 2. Minimally improved aeration the right perihilar lung with persistent atelectasis collapse of the right upper and middle lobes as demonstrated on preceding chest CT. Above findings, specifically that the ex vacuo pneumothorax does not require chest tube placement, was discussed with the patient's hospitalist, Dr. Manuella Miranda. Electronically Signed   By: Sandi Mariscal M.D.   On: 07/10/2019 17:04   Dg Chest Port 1 View  Result Date: 07/10/2019 CLINICAL DATA:  Fall out of bed EXAM: PORTABLE CHEST 1 VIEW COMPARISON:  None. FINDINGS: Moderate to large right pleural effusion with associated atelectasis or consolidation. No obvious displaced rib fracture or pneumothorax. There is diffuse bilateral heterogeneous and  interstitial pulmonary opacity. Mild cardiomegaly. IMPRESSION: Moderate to large right pleural effusion with associated atelectasis or consolidation. No obvious displaced rib fracture or pneumothorax. There is diffuse bilateral heterogeneous and interstitial pulmonary opacity. Mild cardiomegaly. Findings are concerning for multifocal infection with large associated effusion although malignancy is a differential consideration. Consider CT to further evaluate. Electronically Signed   By: Eddie Candle M.D.   On: 07/10/2019 08:26   Ct Maxillofacial Wo Contrast  Result Date: 07/10/2019 CLINICAL DATA:  Fall from bed with forehead lacerations, initial encounter EXAM: CT HEAD WITHOUT CONTRAST CT MAXILLOFACIAL WITHOUT CONTRAST CT CERVICAL SPINE WITHOUT CONTRAST TECHNIQUE: Multidetector CT imaging of the head, cervical spine, and maxillofacial structures were performed using the standard protocol without intravenous contrast. Multiplanar CT image reconstructions of the cervical spine and maxillofacial structures were also generated. COMPARISON:  None. FINDINGS: CT HEAD FINDINGS Brain: Mild atrophic changes are noted. No findings to suggest acute hemorrhage, acute infarction or space-occupying mass lesion are noted. Vascular: No hyperdense vessel or unexpected calcification. Skull: Normal. Negative for fracture or focal lesion. Other: None. CT MAXILLOFACIAL FINDINGS Osseous: No acute bony abnormality is noted. Orbits: Orbits and their contents are within  normal limits. Sinuses: Paranasal sinuses are well aerated bilaterally. Soft tissues: There is a cystic appearing lesion deep to the right parotid gland in the parapharyngeal space on the right. It measures 4 by 1.4 cm in greatest transverse and AP dimensions. Few tiny calcifications are noted within. A fat plane is noted between it and the deep aspect of the right parotid gland although this may still represent a salivary gland tumor. Other considerations such as nerve  sheath tumor or lymphangioma would deserve consideration as well. No other soft tissue abnormality is noted. CT CERVICAL SPINE FINDINGS Alignment: Within normal limits. Skull base and vertebrae: 7 cervical segments are well visualized. Vertebral body height is well maintained. Disc space narrowing is noted from C3 to C7. Facet hypertrophic changes are noted. No acute fracture or acute facet abnormality is noted. The odontoid is within normal limits. Soft tissues and spinal canal: Surrounding soft tissue structures again demonstrate the previously described ovoid cystic lesion deep to the right parotid gland. No other focal abnormality is noted. Upper chest: Visualized lung apices show evidence of a left-sided pleural effusion but incompletely evaluated on this exam. Other: None IMPRESSION: CT of the head: Mild atrophic changes without acute intracranial abnormality. CT of the maxillofacial bones: No acute bony abnormality is noted. Ovoid cystic lesion deep to the right parotid gland in the parapharyngeal space as described. Nonemergent MRI may be helpful for further evaluation. CT of the cervical spine: Multilevel degenerative change without acute bony abnormality. Left-sided pleural effusion seen on the last image and incompletely evaluated on this exam. Electronically Signed   By: Inez Catalina M.D.   On: 07/10/2019 07:53   US Thoracentesis Asp Pleural Space W/img Guide  Result Date: 07/10/2019 INDICATION: Symptomatic right-sided pleural effusion. Please perform ultrasound-guided thoracentesis prior to patient's urgent ocular surgery. EXAM: US THORACENTESIS ASP PLEURAL SPACE W/IMG GUIDE COMPARISON:  Chest radiograph-earlier same day; chest CT-earlier same day MEDICATIONS: None. COMPLICATIONS: SIR Level A - No therapy, no consequence. Procedure complicated by development of an asymptomatic ex vacuo pneumothorax, an expected finding given dense consolidation of the right middle and lobes. TECHNIQUE: Informed  written consent was obtained from the patient after a discussion of the risks, benefits and alternatives to treatment. A timeout was performed prior to the initiation of the procedure. Initial ultrasound scanning demonstrates a large minimally complex right-sided pleural effusion. The lower chest was prepped and draped in the usual sterile fashion. 1% lidocaine was used for local anesthesia. An ultrasound image was saved for documentation purposes. An 8 Fr Safe-T-Centesis catheter was introduced. The thoracentesis was performed. The catheter was removed and a dressing was applied. The patient tolerated the procedure well without immediate post procedural complication. The patient was escorted to have an upright chest radiograph. FINDINGS: A total of approximately 1 liter of serous fluid was removed. Requested samples were sent to the laboratory. IMPRESSION: 1. Successful ultrasound-guided right sided thoracentesis yielding 1 liter of pleural fluid. 2. Procedure complicated by development of an asymptomatic ex vacuo pneumothorax, an expected finding given dense consolidation of the right middle and lobes. Electronically Signed   By: Sandi Mariscal M.D.   On: 07/10/2019 17:06       Thank  you for the consultation and for allowing [facility] to assist in the care of your patient. Our recommendations are noted above.  Please contact us if we can be of further service.   Marda Stalker, M.D., F.C.C.P.  Board Certified in Internal Medicine, Pulmonary Medicine,  Critical Care Medicine, and Sleep Medicine.  [facility] Office Number: [phone]   07/11/2019

## 2019-07-11 NOTE — Evaluation (Signed)
Occupational Therapy Evaluation Patient Details Name: Dan Miranda. MRN: [id number] DOB: 1941/04/21 Today's Date: 07/11/2019    History of Present Illness 78 y.o. male with a PMHx including multiple myeloma, CHF, PVD, HTN, GERD, glaucoma, and recent extended hospital stay at [facility] for viral PNA. Pt presents to ED 2/2 mechanical fall with resulting left eye laceration. S/p thoracentesis of R lung on 07/10/19 and surgical repair of L eye injury on 07/10/19.   Clinical Impression   Pt seen for OT evaluation this date. Pt was independent in all ADLs and mobility, living in a 1 story home with his spouse. Pt had recovered from recent STR stay earlier this year after having an extended hospital stay for PNA elsewhere. Pt does not use O2 at home. Currently on 2 liters of O2 for evaluation. Pt reports recently becoming easily fatigued or out of breath with minimize exertion, requiring extended rest breaks to recover and modifying how/where he sleeps to minimize the SOB he felt. Pt currently requires Sup-CGA for functional mobility and most ADL, Min A for compression stocking mgt due to poor activity tolerance. Pt with impaired vision currently with L eye occluded/bandaged. Pt denies discomfort. With approx 5 min of standing at the sink to perform grooming tasks, pt O2 sat drop to 86% on 2L, improving to 96% within <2 minutes of seated rest and PLB. Pt educated in energy conservation conservation strategies including pursed lip breathing, activity pacing, home/routines modifications, work simplification, AE/DME, prioritizing of meaningful occupations, and falls prevention. Handout provided. Pt verbalized understanding but would benefit from additional skilled OT services to maximize recall and carryover of learned techniques and facilitate implementation of learned techniques into daily routines. Upon discharge, recommend [facility] services.      Follow Up Recommendations  Home health OT    Equipment  Recommendations  Tub/shower seat    Recommendations for Other Services       Precautions / Restrictions Precautions Precautions: Fall Precaution Comments: watch O2 sats with exertion - try to wean to RA Restrictions Weight Bearing Restrictions: No      Mobility Bed Mobility Overal bed mobility: Modified Independent                Transfers Overall transfer level: Needs assistance Equipment used: None Transfers: Sit to/from Stand Sit to Stand: Min guard         General transfer comment: steady, no LOB    Balance Overall balance assessment: No apparent balance deficits (not formally assessed)                                         ADL either performed or assessed with clinical judgement   ADL Overall ADL's : Needs assistance/impaired                                       General ADL Comments: Supervision to CGA for most ADL and associated functional mobility, PRN Min A for LB compression stockings 2/2 SOB.     Vision Baseline Vision/History: Glaucoma Patient Visual Report: Other (comment)(L eye occluded/bandaged)       Perception     Praxis      Pertinent Vitals/Pain Pain Assessment: No/denies pain     Hand Dominance Right   Extremity/Trunk Assessment Upper Extremity Assessment Upper Extremity Assessment: Overall [facility]  for tasks assessed   Lower Extremity Assessment Lower Extremity Assessment: Overall WFL for tasks assessed   Cervical / Trunk Assessment Cervical / Trunk Assessment: Normal   Communication Communication Communication: No difficulties   Cognition Arousal/Alertness: Awake/alert Behavior During Therapy: WFL for tasks assessed/performed Overall Cognitive Status: Within Functional Limits for tasks assessed                                     General Comments  BLE noted with edema, possible blistering; L eye occluded/bandaged    Exercises Other Exercises Other Exercises: Pt  instructed in energy conservation strategies including pursed lip breathing, activity pacing, work simplification, AE/DME, home/routines modifications, and falls prevention strategies; handout provided Other Exercises: Grooming task standing at sink, UE support on counter, CGA to perform; 2L O2 on, O2 sats dropped to 86% with PLB improved quickly to 96%   Shoulder Instructions      Home Living Family/patient expects to be discharged to:: Private residence Living Arrangements: Spouse/significant other Available Help at Discharge: Family;Available 24 hours/day Type of Home: House             Bathroom Shower/Tub: Teacher, early years/pre: Standard     Home Equipment: Cane - single point;Walker - 2 wheels   Additional Comments: has equipment from previous rehab stay after hospitalization      Prior Functioning/Environment Level of Independence: Independent        Comments: Pt since recovered from rehab, ambulating without AD, driving short distances, and idnep with ADL. Pt reports increasing SOB and fatigue with daily activity prior to this admission. No home O2. No other falls.        OT Problem List: Decreased activity tolerance;Decreased knowledge of use of DME or AE;Impaired vision/perception      OT Treatment/Interventions: Self-care/ADL training;Therapeutic exercise;Therapeutic activities;Energy conservation;DME and/or AE instruction;Patient/family education    OT Goals(Current goals can be found in the care plan section) Acute Rehab OT Goals Patient Stated Goal: to go home OT Goal Formulation: With patient Time For Goal Achievement: 07/25/19 Potential to Achieve Goals: Good ADL Goals Additional ADL Goal #1: Pt will verbalize plan to implement at least 2 learned energy conservation strategies to maximize safety/return to independence at home while minimizing SOB/over exertion Additional ADL Goal #2: Pt will utilize pursed lip breathing during functional  mobility and ADL rest breaks to support recovery after exertion with <Min cues. Additional ADL Goal #3: Pt will perform UB/LB dressing with supervision assist and utilizing learned ECS to minimize SOB.  OT Frequency: Min 1X/week   Barriers to D/C:            Co-evaluation              AM-PAC OT "6 Clicks" Daily Activity     Outcome Measure Help from another person eating meals?: None Help from another person taking care of personal grooming?: A Little Help from another person toileting, which includes using toliet, bedpan, or urinal?: A Little Help from another person bathing (including washing, rinsing, drying)?: A Little Help from another person to put on and taking off regular upper body clothing?: None Help from another person to put on and taking off regular lower body clothing?: A Little 6 Click Score: 20   End of Session Equipment Utilized During Treatment: Gait belt  Activity Tolerance: Patient tolerated treatment well Patient left: in bed;with call bell/phone within reach;Other (comment)(seated EOB  with care mgt)  OT Visit Diagnosis: Other abnormalities of gait and mobility (R26.89);History of falling (Z91.81)                Time: 1017-1050 OT Time Calculation (min): 33 min Charges:  OT General Charges $OT Visit: 1 Visit OT Evaluation $OT Eval Moderate Complexity: 1 Mod OT Treatments $Self Care/Home Management : 23-37 mins  Jeni Salles, MPH, MS, OTR/L ascom [phone] 07/11/19, 11:22 AM

## 2019-07-12 ENCOUNTER — Telehealth: Payer: Self-pay

## 2019-07-12 LAB — BASIC METABOLIC PANEL
Anion gap: 7 (ref 5–15)
BUN: 19 mg/dL (ref 8–23)
CO2: 28 mmol/L (ref 22–32)
Calcium: 7.9 mg/dL — ABNORMAL LOW (ref 8.9–10.3)
Chloride: 110 mmol/L (ref 98–111)
Creatinine, Ser: 1.29 mg/dL — ABNORMAL HIGH (ref 0.61–1.24)
GFR calc Af Amer: >60 mL/min (ref >60)
GFR calc non Af Amer: 52 mL/min — ABNORMAL LOW (ref >60)
Glucose, Bld: 115 mg/dL — ABNORMAL HIGH (ref 70–99)
Potassium: 3.8 mmol/L (ref 3.5–5.1)
Sodium: 145 mmol/L (ref 135–145)

## 2019-07-12 LAB — CBC
HCT: 29.2 % — ABNORMAL LOW (ref 39.0–52.0)
Hemoglobin: 8.7 g/dL — ABNORMAL LOW (ref 13.0–17.0)
MCH: 26.1 pg (ref 26.0–34.0)
MCHC: 29.8 g/dL — ABNORMAL LOW (ref 30.0–36.0)
MCV: 87.7 fL (ref 80.0–100.0)
Platelets: 186 10*3/uL (ref 150–400)
RBC: 3.33 MIL/uL — ABNORMAL LOW (ref 4.22–5.81)
RDW: 19.2 % — ABNORMAL HIGH (ref 11.5–15.5)
WBC: 6.4 10*3/uL (ref 4.0–10.5)
nRBC: 0 % (ref 0.0–0.2)

## 2019-07-12 LAB — PROTEIN, BODY FLUID (OTHER): Total Protein, Body Fluid Other: 1.8 g/dL

## 2019-07-12 LAB — TRIGLYCERIDES, BODY FLUIDS: Triglycerides, Fluid: 10 mg/dL

## 2019-07-12 LAB — CYTOLOGY - NON PAP

## 2019-07-12 LAB — PH, BODY FLUID: pH, Body Fluid: 7.7

## 2019-07-12 LAB — GLUCOSE, CAPILLARY: Glucose-Capillary: 99 mg/dL (ref 70–99)

## 2019-07-12 MED ORDER — HYPROMELLOSE (GONIOSCOPIC) 2.5 % OP SOLN: 1.0000 [drp] | Freq: Three times a day (TID) | OPHTHALMIC | 12 refills | Status: AC | PRN

## 2019-07-12 MED ORDER — MOXIFLOXACIN HCL 0.5 % OP SOLN: 1.0000 [drp] | Freq: Every day | OPHTHALMIC | 0 refills | Status: AC

## 2019-07-12 MED ORDER — PREDNISOLONE ACETATE 1 % OP SUSP: 1.0000 [drp] | Freq: Every day | OPHTHALMIC | 0 refills | Status: AC

## 2019-07-12 NOTE — Telephone Encounter (Signed)
-----   Message from Laverle Hobby, MD sent at 07/12/2019 12:09 PM EDT ----- Regarding: hfu Pt needs chest xray and follow up with me in 2 weeks for pleural effusion.

## 2019-07-12 NOTE — Plan of Care (Signed)
Patient is being d/ced home

## 2019-07-12 NOTE — Discharge Instructions (Signed)
Pleural Effusion °Pleural effusion is an abnormal buildup of fluid in the layers of tissue between the lungs and the inside of the chest (pleural space) The two layers of tissue that line the lungs and the inside of the chest are called pleura. Usually, there is no air in the space between the pleura, only a thin layer of fluid. Some conditions can cause a large amount of fluid to build up, which can cause the lung to collapse if untreated. A pleural effusion is usually caused by another disease that requires treatment. °What are the causes? °Pleural effusion can be caused by: °· Heart failure. °· Certain infections, such as pneumonia or tuberculosis. °· Cancer. °· A blood clot in the lung (pulmonary embolism). °· Complications from surgery, such as from open heart surgery. °· Liver disease (cirrhosis). °· Kidney disease. °What are the signs or symptoms? °In some cases, pleural effusion may cause no symptoms. If symptoms are present, they may include: °· Shortness of breath, especially when lying down. °· Chest pain. This may get worse when taking a deep breath. °· Fever. °· Dry, long-lasting (chronic) cough. °· Hiccups. °· Rapid breathing. °An underlying condition that is causing the pleural effusion (such as heart failure, pneumonia, blood clots, tuberculosis, or cancer) may also cause other symptoms. °How is this diagnosed? °This condition may be diagnosed based on: °· Your symptoms and medical history. °· A physical exam. °· A chest X-ray. °· A procedure to use a needle to remove fluid from the pleural space (thoracentesis). This fluid is tested. °· Other imaging studies of the chest, such as ultrasound or CT scan. °How is this treated? °Depending on the cause of your condition, treatment may include: °· Treating the underlying condition that is causing the effusion. When that condition improves, the effusion will also improve. Examples of treatment for underlying conditions include: °? Antibiotic medicines to  treat an infection. °? Diuretics or other heart medicines to treat heart failure. °· Thoracentesis. °· Placing a thin flexible tube under your skin and into your chest to continuously drain the effusion (indwelling pleural catheter). °· Surgery to remove the outer layer of tissue from the pleural space (decortication). °· A procedure to put medicine into the chest cavity to seal the pleural space and prevent fluid buildup (pleurodesis). °· Chemotherapy and radiation therapy, if you have cancerous (malignant) pleural effusion. These treatments are typically used to treat cancer. They kill certain cells in the body. °Follow these instructions at home: °· Take over-the-counter and prescription medicines only as told by your health care provider. °· Ask your health care provider what activities are safe for you. °· Keep track of how long you are able to do mild exercise (such as walking) before you get short of breath. Write down this information to share with your health care provider. Your ability to exercise should improve over time. °· Do not use any products that contain nicotine or tobacco, such as cigarettes and e-cigarettes. If you need help quitting, ask your health care provider. °· Keep all follow-up visits as told by your health care provider. This is important. °Contact a health care provider if: °· The amount of time that you are able to do mild exercise: °? Decreases. °? Does not improve with time. °· You have a fever. °Get help right away if: °· You are short of breath. °· You develop chest pain. °· You develop a new cough. °Summary °· Pleural effusion is an abnormal buildup of fluid in the layers   of tissue between the lungs and the inside of the chest. °· Pleural effusion can have many causes, including heart failure, pulmonary embolism, infections, or cancer. °· Symptoms of pleural effusion can include shortness of breath, chest pain, fever, long-lasting (chronic) cough, hiccups, or rapid  breathing. °· Diagnosis often involves making images of the chest (such as with ultrasound or X-ray) and removing fluid (thoracentesis) to send for testing. °· Treatment for pleural effusion depends on what underlying condition is causing it. °This information is not intended to replace advice given to you by your health care provider. Make sure you discuss any questions you have with your health care provider. °Document Released: 12/12/2005 Document Revised: 11/24/2017 Document Reviewed: 08/17/2017 °Elsevier Patient Education © 2020 [employer] ° °

## 2019-07-12 NOTE — Telephone Encounter (Signed)
Left message with pt's spouse, requesting that pt return our call to schedule f/u. Pt's spouse, Oneal Grout is requesting a call from Dr. Ashby Dawes directly to discuss an 8hr surgery that was mentioned to pt.  DR please advise. Thanks

## 2019-07-12 NOTE — Progress Notes (Signed)
Daily Progress Note   Patient Name: Dan Miranda.       Date: 07/12/2019 DOB: [birth year]  Age: 78 y.o. MRN#: [id number] Attending Physician: Max Sane, MD Primary Care Physician: Patient, No Pcp Per Admit Date: 07/10/2019  Reason for Consultation/Follow-up: Establishing goals of care  Subjective: Patient is sitting on the side of the bed. He is alert and oriented. Shield is no longer in place to the left eye. He states his vision is blurred in the eye.   He tells me of the conversation with pulmonology. He states they discussed the need for further draining (thoracentesis) , tube placement (pleurex cath) , or surgery (VATS/open thoracotomy). 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 He states there is risk with continuing to drain the lung, and with the drain tube. He states there is risk with surgery, but at least it would be definitive and not ongoing treatment/procedures, and would better his life. He states he is not afraid to die.    We discussed his diagnosis, prognosis, GOC, EOL wishes disposition and options.  A detailed discussion was had today regarding advanced directives.  Concepts specific to code status, artifical feeding and hydration, IV antibiotics and rehospitalization were discussed.  The difference between an aggressive medical intervention path and a comfort care path was discussed.  Values and goals of care important to patient and family were attempted to be elicited.  Discussed limitations of medical interventions to prolong quality of life in some situations and discussed the concept of human mortality.  He states QOL is important to him, and he would like to treat the treatable as long as he can keep  his QOL. He states he would not want to live in a vegetative state. He tells me of previous deconditioning as he states he was able to walk prior to his last admission to the hospital, but upon discharge, could no longer walk. He states he is now able to walk again after assistance from rehab.   He states he would never want chest compressions, shocks, or a breathing tube for cardio/pulmonary arrest. He states he would not want to be placed on a ventilator for any reason besides perioperatively with thoracotomy. He states if he is not able to  wean off the ventilator in 1 week, he would want to shift to comfort. He has had a feeding tube in the past, and states he did not like it and kept removing them after reinsertion. He would not want a feeding tube in the future.   I completed a MOST form today and the signed original was placed in the chart. A photocopy was placed in the chart to be scanned into EMR. The patient outlined their wishes for the following treatment decisions:  Cardiopulmonary Resuscitation: Do Not Attempt Resuscitation (DNR/No CPR)  Medical Interventions: Limited Additional Interventions: Use medical treatment, IV fluids and cardiac monitoring as indicated, DO NOT USE intubation or mechanical ventilation. May consider use of less invasive airway support such as BiPAP or CPAP. Also provide comfort measures. Transfer to the hospital if indicated. Avoid intensive care.   Antibiotics: Antibiotics if indicated  IV Fluids: IV fluids for a defined trial period  Feeding Tube: No feeding tube      Length of Stay: 2  Current Medications: Scheduled Meds:  . docusate sodium  100 mg Oral BID  . feeding supplement (ENSURE ENLIVE)  237 mL Oral BID BM  . furosemide  40 mg Intravenous BID  . moxifloxacin  1 drop Left Eye 6 X Daily  . prednisoLONE acetate  1 drop Left Eye 6 X Daily  . Tdap  0.5 mL Intramuscular Once    Continuous Infusions: . sodium chloride 50 mL/hr at 07/12/19 0340     PRN Meds: acetaminophen **OR** acetaminophen, bisacodyl, HYDROcodone-acetaminophen, ipratropium-albuterol, ondansetron **OR** ondansetron (ZOFRAN) IV, traZODone  Physical Exam HENT:     Head: Normocephalic.  Eyes:     Comments: Left eye red.   Pulmonary:     Effort: Pulmonary effort is normal.  Skin:    General: Skin is warm and dry.  Neurological:     Mental Status: He is alert.             Vital Signs: BP 129/75 (BP Location: Right Arm)   Pulse 87   Temp 97.9 F (36.6 C) (Oral)   Resp 18   Ht 6' (1.829 m)   Wt 104 kg   SpO2 97%   BMI 31.10 kg/m  SpO2: SpO2: 97 % O2 Device: O2 Device: Room Air O2 Flow Rate: O2 Flow Rate (L/min): 2 L/min  Intake/output summary:   Intake/Output Summary (Last 24 hours) at 07/12/2019 1056 Last data filed at 07/12/2019 1003 Gross per 24 hour  Intake 2208.55 ml  Output 300 ml  Net 1908.55 ml   LBM: Last BM Date: 07/09/19 Baseline Weight: Weight: 99.3 kg Most recent weight: Weight: 104 kg       Palliative Assessment/Data:      Patient Active Problem List   Diagnosis Date Noted  . Large pleural effusion 07/10/2019    Palliative Care Assessment & Plan   Recommendations/Plan:  Recommend palliative to follow outpatient.     Code Status:    Code Status Orders  (From admission, onward)         Start     Ordered   07/10/19 1442  Full code  Continuous     07/10/19 1442        Code Status History    This patient has a current code status but no historical code status.   Advance Care Planning Activity       Prognosis:   Unable to determine  Discharge Planning:  Home with Palliative Services  Care plan was discussed with  CM/SW  Thank you for allowing the Palliative Medicine Team to assist in the care of this patient.   Time In: 10:08 Time Out: 11:15 Total Time 67 min Prolonged Time Billed yes      Greater than 50%  of this time was spent counseling and coordinating care related to the above assessment  and plan.  Asencion Gowda, NP  Please contact Palliative Medicine Team phone at [phone] for questions and concerns.

## 2019-07-12 NOTE — Progress Notes (Signed)
New referral for outpatient Palliative to follow at home received from [facility]. Patient to discharge home today. Patient information faxed to Referral. Flo Shanks BSN, RN, [place] [phone]

## 2019-07-12 NOTE — Telephone Encounter (Signed)
Pt has been scheduled for HFU on 07/22/2019 . Pt aware that CXR is needed prior to appt. Pt is scheduled for f/u with Dr. Genevive Bi on 07/19/2019. I have made both pt and his spouse aware that Dr. Genevive Bi will be able to answer all questions regarding surgery.   Nothing further is needed at this time.

## 2019-07-12 NOTE — Progress Notes (Signed)
Pt is d/ced home.  IV removed.  Will review d/c instructions and f/u appts and new medications.  Pt's son in law is taking him home.

## 2019-07-12 NOTE — Progress Notes (Addendum)
Pt having some SOB and wheezing, sats at 99% on 3L.  Gardiner Barefoot NP notified. Duoneb treatments ordered and given. Patient states that he feels better. Will continue to monitor.  Stacie Glaze, RN

## 2019-07-13 LAB — ACID FAST SMEAR (AFB, MYCOBACTERIA): Acid Fast Smear: NEGATIVE

## 2019-07-13 NOTE — Discharge Summary (Signed)
Dan Miranda at [facility] NAME: Dan Miranda    MR#:  [id number]  DATE OF BIRTH:  1941/04/30  DATE OF ADMISSION:  07/10/2019   ADMITTING PHYSICIAN: Dan Sane, MD  DATE OF DISCHARGE: 07/12/2019  2:38 PM  PRIMARY CARE PHYSICIAN: Patient, No Pcp Per   ADMISSION DIAGNOSIS:  Shortness of breath [R06.02] Status post thoracentesis [Z98.890] Eye injury, initial encounter [S05.90XA] DISCHARGE DIAGNOSIS:  Active Problems:   Large pleural effusion  SECONDARY DIAGNOSIS:   Past Medical History:  Diagnosis Date  . Multiple myeloma [facility])    HOSPITAL COURSE:   78 y m admitted s/p fall  * Recurrent right pleural effusion, Right-sided lung entrapment, status post right thoracentesis with removal of 1 liter fluid on 7/15. - The patient is feeling significantly better since the thoracentesis, and is ready to go home.  Discussed with him that his right-sided lung collapse may be a chronic condition, and may require further intervention such as chest tube, bronchoscopy, thoracotomy.  He gets most of his care  at the [place] in [place].  - He is requesting to see lung doctor here so I've set him up to see Dr Dan Miranda. He is aware that the pleural effusion is likely to recur and will likely require further interventions.  - Dr Dan Miranda is considering repeat chest x-ray in 1 week to see if the fluid needs to be drained again.  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 - eye shield in place. Do not manipulate the eye, no pressure on the eye.  * Hypokalemia: repleted  * Fall : likely multifactorial.  * History of multiple myeloma - f/by [facility]  DISCHARGE CONDITIONS:  stable CONSULTS OBTAINED:   DRUG ALLERGIES:   Allergies  Allergen Reactions  . Benazepril Other (See Comments)  .  Lemon Oil Other (See Comments)  . Lime, Sulfurated Other (See Comments)   DISCHARGE MEDICATIONS:   Allergies as of 07/12/2019      Reactions   Benazepril Other (See Comments)   Lemon Oil Other (See Comments)   Lime, Sulfurated Other (See Comments)      Medication List    TAKE these medications   acyclovir 400 MG tablet Commonly known as: ZOVIRAX Take 400 mg by mouth 2 (two) times a day.   atorvastatin 80 MG tablet Commonly known as: LIPITOR Take 40 mg by mouth at bedtime.   ferrous sulfate 325 (65 FE) MG tablet Take 325 mg by mouth daily.   finasteride 5 MG tablet Commonly known as: PROSCAR Take 5 mg by mouth every evening.   hydroxypropyl methylcellulose / hypromellose 2.5 % ophthalmic solution Commonly known as: ISOPTO TEARS / GONIOVISC Place 1 drop into the left eye 3 (three) times daily as needed for dry eyes.   moxifloxacin 0.5 % ophthalmic solution Commonly known as: VIGAMOX Place 1 drop into the left eye 6 (six) times daily.   pantoprazole 40 MG tablet Commonly known as: PROTONIX Take 40 mg by mouth daily.   potassium chloride SA 20 MEQ tablet Commonly known as: K-DUR Take 20 mEq by mouth 2 (two) times a day.   prednisoLONE acetate 1 % ophthalmic suspension Commonly known as: PRED FORTE Place 1 drop into the left eye 6 (six) times daily.   terazosin 5 MG capsule Commonly  known as: HYTRIN Take 5 mg by mouth 2 (two) times daily.   torsemide 20 MG tablet Commonly known as: DEMADEX Take 20 mg by mouth daily.   vitamin B-12 1000 MCG tablet Commonly known as: CYANOCOBALAMIN Take 1,000 mcg by mouth daily.      DISCHARGE INSTRUCTIONS:   DIET:  Regular diet DISCHARGE CONDITION:  Good ACTIVITY:  Activity as tolerated OXYGEN:  Home Oxygen: No.  Oxygen Delivery: room air DISCHARGE LOCATION:  home with home health, palliative care to follow  If you experience worsening of your admission symptoms, develop shortness of breath, life threatening  emergency, suicidal or homicidal thoughts you must seek medical attention immediately by calling 911 or calling your MD immediately  if symptoms less severe.  You Must read complete instructions/literature along with all the possible adverse reactions/side effects for all the Medicines you take and that have been prescribed to you. Take any new Medicines after you have completely understood and accpet all the possible adverse reactions/side effects.   Please note  You were cared for by a hospitalist during your hospital stay. If you have any questions about your discharge medications or the care you received while you were in the hospital after you are discharged, you can call the unit and asked to speak with the hospitalist on call if the hospitalist that took care of you is not available. Once you are discharged, your primary care physician will handle any further medical issues. Please note that NO REFILLS for any discharge medications will be authorized once you are discharged, as it is imperative that you return to your primary care physician (or establish a relationship with a primary care physician if you do not have one) for your aftercare needs so that they can reassess your need for medications and monitor your lab values.    On the day of Discharge:  VITAL SIGNS:  Blood pressure 129/75, pulse 87, temperature 97.9 F (36.6 C), temperature source Oral, resp. rate 18, height 6' (1.829 m), weight 104 kg, SpO2 97 %. PHYSICAL EXAMINATION:  GENERAL:  78 y.o.-year-old patient lying in the bed with no acute distress.  EYES: Pupils equal, round, reactive to light and accommodation. No scleral icterus. Extraocular muscles intact.  HEENT: Head atraumatic, normocephalic. Oropharynx and nasopharynx clear.  NECK:  Supple, no jugular venous distention. No thyroid enlargement, no tenderness.  LUNGS: Normal breath sounds bilaterally, no wheezing, rales,rhonchi or crepitation. No use of accessory muscles  of respiration.  CARDIOVASCULAR: S1, S2 normal. No murmurs, rubs, or gallops.  ABDOMEN: Soft, non-tender, non-distended. Bowel sounds present. No organomegaly or mass.  EXTREMITIES: No pedal edema, cyanosis, or clubbing.  NEUROLOGIC: Cranial nerves II through XII are intact. Muscle strength 5/5 in all extremities. Sensation intact. Gait not checked.  PSYCHIATRIC: The patient is alert and oriented x 3.  SKIN: No obvious rash, lesion, or ulcer.  DATA REVIEW:   CBC Recent Labs  Lab 07/12/19 0640  WBC 6.4  HGB 8.7*  HCT 29.2*  PLT 186    Chemistries  Recent Labs  Lab 07/10/19 0835 07/11/19 0441 07/12/19 0640  NA 145 146* 145  K 3.3* 3.4* 3.8  CL 111 111 110  CO2 _0 GLUCOSE 101* 132* 115*  BUN _1 CREATININE 0.80 0.83 1.29*  CALCIUM 8.5* 8.3* 7.9*  MG  --  2.1  --   AST 18  --   --   ALT 14  --   --   ALKPHOS 98  --   --  BILITOT 2.5*  --   --      Follow-up Information    Eulogio Bear, MD. Go on 07/16/2019.   Specialty: Ophthalmology Why: @ 8:30 AM Contact information: [place] [address] [phone]        Laverle Hobby, MD. Go on 07/22/2019.   Specialty: Pulmonary Disease Why: _0 :30 AM Contact information: [address] [place] [address] [phone]        Nestor Lewandowsky, MD. Go on 07/19/2019.   Specialties: Cardiothoracic Surgery, General Surgery Why: _1 :00 AM Contact information: [address] [place] [address] [phone]           Management plans discussed with the patient, family and they are in agreement.  CODE STATUS: Prior   TOTAL TIME TAKING CARE OF THIS PATIENT: 45 minutes.    Dan Miranda M.D on 07/13/2019 at 5:23 PM  Between 7am to 6pm - Pager - [phone]  After 6pm go to [email or web address] - Proofreader  [facility]  Office  [phone]  CC: Primary care physician; Patient, No Pcp Per   Note:  This dictation was prepared with Dragon dictation along with smaller phrase technology. Any transcriptional errors that result from this process are unintentional.

## 2019-07-14 LAB — BODY FLUID CULTURE: Culture: NO GROWTH

## 2019-07-15 LAB — COMP PANEL: LEUKEMIA/LYMPHOMA

## 2019-07-16 ENCOUNTER — Other Ambulatory Visit: Payer: Self-pay

## 2019-07-16 DIAGNOSIS — Z9889 Other specified postprocedural states: Secondary | ICD-10-CM

## 2019-07-17 LAB — CHOLESTEROL, BODY FLUID: Cholesterol, Fluid: 14 mg/dL

## 2019-07-19 ENCOUNTER — Telehealth: Payer: Self-pay | Admitting: *Deleted

## 2019-07-19 ENCOUNTER — Inpatient Hospital Stay: Payer: Self-pay | Admitting: Cardiothoracic Surgery

## 2019-07-19 NOTE — Telephone Encounter (Signed)
Patient called and stated that they are not able to make it in today due to transportation, I rescheduled him but they are concerned about the sutures in. Please call and advise

## 2019-07-19 NOTE — Telephone Encounter (Signed)
Patient states he had a head injury and was seen in the Emergency and sutures were placed. He has an appointment on 07/22/2019 with pulmonologist and he was instructed to ask he could remove sutures. He was told he could remove the bandage and wash his face but do not scrub over the incision.   Keep appointment with Dr.oaks 08/02/2019.

## 2019-07-21 NOTE — Progress Notes (Addendum)
* [facility]     Assessment and Plan:  Recurrent right pleural effusion. Right-sided lung entrapment, status post right thoracentesis. History of multiple myeloma, PJP pneumonia. - We will schedule ASAP right thoracentesis for symptomatic relief.  Explained to patient that his fluid is likely to recur - Patient recommended follow-up with thoracic surgery for possible pleural biopsy and decortication. - Patient may require airway examination concurrently with surgery to rule out endobronchial lesions. - Hypoxemia with exertion noted today, though oxygen saturation normal at rest.  Patient would like to get his oxygen supplies from the [facility], therefore he will follow-up with his [facility].  Pulmonary hypertension. - Severe pulmonary hypertension noted on recent echocardiogram with RVSP of 20.  Combined with the significant lower extremity edema, suggested pulmonary hypertension with possible cor pulmonale.  Will require further work-up once we have addressed his acute issues.  Multiple myeloma. - Patient is followed up by his physician at [facility] in [place]. - Has follow-up appointment next week.  Orbital fracture. - Patient missed his appointment to have the sutures removed last week.  He asked that I remove them.  I offered to remove in 1 to 2 hours, however patient would not like to wait at this time and will have them removed next week when he follows up with Dr. Genevive Bi.  Site appears clean at this time.  Orders Placed This Encounter  Procedures  . US THORACENTESIS ASP PLEURAL SPACE W/IMG GUIDE   Return in about 4 weeks (around 08/19/2019).   Addendum 07/25/2019: Patient underwent right-sided thoracentesis with drainage of about 750 cc of blood-tinged fluid with good relief of dyspnea.  The fluid appeared to be loculated.  Results discussed with wife.  Patient has follow-up with Dr. Genevive Bi next week which I think would be helpful for next steps.  I am doubtful that the  patient could tolerate a thoracotomy for treatment of his right trapped lung, however he might benefit from placement of a Pleurx catheter.  I discussed that the fluid will likely return and this would continue to be a chronic problem.  Given the presence of loculations I doubt that repeated thoracentesis would be possible. -DR.    Date: 07/22/2019  MRN# [id number] Dan Miranda. 1941/06/24   Dan Miranda. is a 78 y.o. old male seen in follow up for chief complaint of  Chief Complaint  Patient presents with  . Hospitalization Follow-up      Was in the hospital and had lung drained states (R) lung is completely colllapsed. Having increased SOB. Placed on O2_0  due to low SAT.     HPI:   Dan Miranda. is a 78 y.o. male with a history of multiple myeloma, hypertension, chronic venous insufficiency, GERD, open-angle glaucoma presented to the ED after a fall with a laceration of his forehead, which occurred when he rolled out of bed.  He had blurry vision in the left eye with left eye prolapse and pain.  On 7/15 the patient underwent repair of corneal laceration with re-posting of uveal tissue of the left eye.  Patient was also noted to have a large right-sided pleural effusion, he underwent a right-sided thoracentesis by interventional radiology on 7/15 with removal of 1 L of serous pleural fluid. Fluid appeared to be inflammatory, cytology and flow cytometry negative.  After that there appeared to be lung entrapment, he was advised further follow with his doctors at [facility] as he may require interventions such as bronchoscopy, thoracotomy, chest  tube. However he preferred to followup here, therefore asked to repeat CXR before follow up.  He has a significant history of PJP pneumonia which occurred when the patient was on chronic steroids for multiple myeloma, admitted for about 2 months in late 2019 at the [facility], (see  below).   Today he notes that his breathing has again declined since  being discharged from the hospital progressively. He is going back to see his cancer doctor for Mult myeloma at [place] on 8/05.  He had an appointment to see thoracic surgery but missed the appointment, and has been rescheduled to next week on 8/7.  And appointment to have his sutures on his forehead removed, but missed the appointment.  Patient requesting [place] to me to remove sutures.  Chest x-ray 07/22/2019>> images personally reviewed, in comparison with previous, there remains significant right-sided lung collapse with lung entrapment, there is a recurrence of a moderate right-sided pleural effusion compared with previous image on 07/10/2019.  He had a lengthy admission at [facility] on 09/28/2018 and treated for P JP pneumonia, He was treated inpatient with Bactrim (10/22-28), transitioned to clindamycin and primaquine (10/28-11/11) due to AKI presumed 2/2 Bactrim. He was ultimately placed back on Bactrim for ppx per the recommendations of ID. He was discharged on Bactrim 800/160 MWF as well as prophylactic acyclovir. He had notably been receiving chronic dexamethasone for the preceding 18 months as part of his multiple myeloma treatment, and had not been on PJP prophylaxis at that time.  discharged on 11/27/2018. Then admitted to [facility] from SNF on 11/30/2018 for 3 days for hypoglycemia and altered mental status.  At that time he was also noted to have a large right-sided pleural effusion, underwent thoracentesis on 11/30/2018 with 1 L removed, thought to be secondary to heart failure.  He was discharged with torsemide 40 mg daily with potassium and continued prophylaxis of Bactrim 800/160 MWF.  Chest x-ray and CT chest 07/10/2019>> imaging personally reviewed, there is right-sided pneumothorax, the right lung appears to be trapped, and is not reexpanded after drainage of pleural fluid. Review of CT chest shows no obvious mass, though there is an area in the RLL bronchus which could be tumor  vs mucus plug.  **Echocardiogram 07/11/2019>> EF 55%.  RV systolic pressure severely elevated at 70.  Medication:    Current Outpatient Medications:  .  acyclovir (ZOVIRAX) 400 MG tablet, Take 400 mg by mouth 2 (two) times a day., Disp: , Rfl:  .  atorvastatin (LIPITOR) 80 MG tablet, Take 40 mg by mouth at bedtime., Disp: , Rfl:  .  ferrous sulfate 325 (65 FE) MG tablet, Take 325 mg by mouth daily., Disp: , Rfl:  .  finasteride (PROSCAR) 5 MG tablet, Take 5 mg by mouth every evening. , Disp: , Rfl:  .  hydroxypropyl methylcellulose / hypromellose (ISOPTO TEARS / GONIOVISC) 2.5 % ophthalmic solution, Place 1 drop into the left eye 3 (three) times daily as needed for dry eyes., Disp: 15 mL, Rfl: 12 .  moxifloxacin (VIGAMOX) 0.5 % ophthalmic solution, Place 1 drop into the left eye 6 (six) times daily., Disp: 3 mL, Rfl: 0 .  pantoprazole (PROTONIX) 40 MG tablet, Take 40 mg by mouth daily., Disp: , Rfl:  .  potassium chloride SA (K-DUR) 20 MEQ tablet, Take 20 mEq by mouth 2 (two) times a day., Disp: , Rfl:  .  prednisoLONE acetate (PRED FORTE) 1 % ophthalmic suspension, Place 1 drop into the left  eye 6 (six) times daily., Disp: 5 mL, Rfl: 0 .  terazosin (HYTRIN) 5 MG capsule, Take 5 mg by mouth 2 (two) times daily. , Disp: , Rfl:  .  torsemide (DEMADEX) 20 MG tablet, Take 20 mg by mouth daily. , Disp: , Rfl:  .  vitamin B-12 (CYANOCOBALAMIN) 1000 MCG tablet, Take 1,000 mcg by mouth daily., Disp: , Rfl:    Allergies:  Benazepril; Lemon oil; and Lime, sulfurated  Review of Systems:  Constitutional: Feels well. Cardiovascular: Denies chest pain, exertional chest pain.  Pulmonary: Denies hemoptysis, pleuritic chest pain.   The remainder of systems were reviewed and were found to be negative other than what is documented in the HPI.    Physical Examination:   VS: BP 120/70 (BP Location: Right Arm, Cuff Size: Normal)   Pulse (!) 105   Temp 97.9 F (36.6 C) (Skin)   Ht 6' (1.829 m)   Wt 209  lb 6.4 oz (95 kg)   SpO2 99% Comment: O2 SAT initially 83 on room air initiated O2_0  liters. after resting recoverd and is currently at 99% on O2_1   BMI 28.40 kg/m   General Appearance: No distress  Neuro:without focal findings, mental status, speech normal, alert and oriented HEENT: PERRLA, EOM intact sutures seen and forehead. Pulmonary: No wheezing, No rales decreased air entry in right lung with dullness to percussion at right base. CardiovascularNormal S1,S2.  No m/r/g.  Abdomen: Benign, Soft, non-tender, No masses Renal:  No costovertebral tenderness  GU:  No performed at this time. Endoc: No evident thyromegaly, no signs of acromegaly or Cushing features Skin:   warm, no rashes, no ecchymosis  Extremities: normal, no cyanosis, clubbing.  3+ bilateral lower extremity edema.      LABORATORY PANEL:   CBC No results for input(s): WBC, HGB, HCT, PLT in the last 168 hours. ------------------------------------------------------------------------------------------------------------------  Chemistries  No results for input(s): NA, K, CL, CO2, GLUCOSE, BUN, CREATININE, CALCIUM, MG, AST, ALT, ALKPHOS, BILITOT in the last 168 hours.  Invalid input(s): GFRCGP ------------------------------------------------------------------------------------------------------------------  Cardiac Enzymes No results for input(s): TROPONINI in the last 168 hours. ------------------------------------------------------------  RADIOLOGY:   No results found for this or any previous visit. No results found for this or any previous visit. ------------------------------------------------------------------------------------------------------------------  Thank  you for allowing [facility] to assist in the care of your patient. Our recommendations are noted above.  Please contact us if we can be of further service.   Marda Stalker, M.D., F.C.C.P.  Board Certified in  Internal Medicine, Pulmonary Medicine, [place], and Sleep Medicine.  [facility] Office Number: [phone]  07/22/2019

## 2019-07-22 ENCOUNTER — Other Ambulatory Visit: Payer: Self-pay

## 2019-07-22 ENCOUNTER — Ambulatory Visit (INDEPENDENT_AMBULATORY_CARE_PROVIDER_SITE_OTHER): Payer: Medicare Other | Admitting: Internal Medicine

## 2019-07-22 ENCOUNTER — Ambulatory Visit
Admission: RE | Admit: 2019-07-22 | Discharge: 2019-07-22 | Disposition: A | Discharge status: Home / Self Care | Payer: Medicare Other | Source: Ambulatory Visit | Attending: Cardiothoracic Surgery | Admitting: Cardiothoracic Surgery

## 2019-07-22 ENCOUNTER — Telehealth: Payer: Self-pay | Admitting: Internal Medicine

## 2019-07-22 ENCOUNTER — Encounter: Payer: Self-pay | Admitting: Internal Medicine

## 2019-07-22 ENCOUNTER — Other Ambulatory Visit
Admission: RE | Admit: 2019-07-22 | Discharge: 2019-07-22 | Disposition: A | Discharge status: Home / Self Care | Payer: Medicare Other | Source: Ambulatory Visit | Attending: Internal Medicine | Admitting: Internal Medicine

## 2019-07-22 VITALS — BP 120/70 | HR 105 | Temp 97.9°F | Ht 72.0 in | Wt 209.4 lb

## 2019-07-22 DIAGNOSIS — J948 Other specified pleural conditions: Secondary | ICD-10-CM | POA: Diagnosis not present

## 2019-07-22 DIAGNOSIS — J90 Pleural effusion, not elsewhere classified: Secondary | ICD-10-CM

## 2019-07-22 DIAGNOSIS — Z9889 Other specified postprocedural states: Secondary | ICD-10-CM

## 2019-07-22 DIAGNOSIS — Z20828 Contact with and (suspected) exposure to other viral communicable diseases: Secondary | ICD-10-CM | POA: Diagnosis not present

## 2019-07-22 LAB — SARS CORONAVIRUS 2 (TAT 6-24 HRS): SARS Coronavirus 2: NEGATIVE

## 2019-07-22 NOTE — Patient Instructions (Addendum)
Will need to schedule thoracentesis.  Recommend that you follow-up with your [place] physician to obtain oxygen. Remember to follow-up to see Dr. Genevive Bi next week.

## 2019-07-22 NOTE — Telephone Encounter (Signed)
Called pt to let him know that a VA referral had not been placed  and in order for VA to cover Pulmonary services he'd need to follow up and schedule appt. With them to obtain that referral. This would mean that his scheduled procedure would be pushed out until authorization is received.  Pt has secondary insurance and asked that we only file that instead because he can not afford to push out his procedure waiting for authorization from [place]. I advised pt that if authorization is not received, VA may not cover any future pulmonary services until they have authorized it, he confirmed and understands.

## 2019-07-23 ENCOUNTER — Telehealth: Payer: Self-pay | Admitting: Internal Medicine

## 2019-07-23 NOTE — Telephone Encounter (Signed)
DR please advise. Thanks 

## 2019-07-24 NOTE — Telephone Encounter (Signed)
Stacy with [facility] is aware of below recommendations and voiced her understanding.  Nothing further is needed.

## 2019-07-24 NOTE — Telephone Encounter (Signed)
Yes in agreement.

## 2019-07-25 ENCOUNTER — Other Ambulatory Visit: Payer: Self-pay

## 2019-07-25 ENCOUNTER — Ambulatory Visit
Admission: RE | Admit: 2019-07-25 | Discharge: 2019-07-25 | Disposition: A | Discharge status: Home / Self Care | Payer: Medicare Other | Source: Ambulatory Visit | Attending: Internal Medicine | Admitting: Internal Medicine

## 2019-07-25 DIAGNOSIS — J90 Pleural effusion, not elsewhere classified: Secondary | ICD-10-CM

## 2019-07-25 DIAGNOSIS — J984 Other disorders of lung: Secondary | ICD-10-CM | POA: Insufficient documentation

## 2019-07-25 HISTORY — DX: Gastro-esophageal reflux disease without esophagitis: K21.9

## 2019-07-25 HISTORY — DX: Type 2 diabetes mellitus without complications: E11.9

## 2019-07-25 HISTORY — DX: Dyspnea, unspecified: R06.00

## 2019-07-25 LAB — BODY FLUID CELL COUNT WITH DIFFERENTIAL
Eos, Fluid: 0 %
Lymphs, Fluid: 2 %
Monocyte-Macrophage-Serous Fluid: 0 %
Neutrophil Count, Fluid: 98 %
Total Nucleated Cell Count, Fluid: 17241 cu mm

## 2019-07-25 LAB — ALBUMIN, PLEURAL OR PERITONEAL FLUID: Albumin, Fluid: 1.1 g/dL

## 2019-07-25 LAB — PROTEIN, PLEURAL OR PERITONEAL FLUID: Total protein, fluid: <3 g/dL

## 2019-07-25 LAB — LACTATE DEHYDROGENASE, PLEURAL OR PERITONEAL FLUID: LD, Fluid: 1634 U/L — ABNORMAL HIGH (ref 3–23)

## 2019-07-25 LAB — AMYLASE, PLEURAL OR PERITONEAL FLUID: Amylase, Fluid: 33 U/L

## 2019-07-25 MED ORDER — SODIUM CHLORIDE 0.9 % IV SOLN
INTRAVENOUS | Status: DC | PRN
  Administered 2019-07-25: 13:00:00 1000 mL via INTRAVENOUS

## 2019-07-25 NOTE — Discharge Instructions (Signed)
Thoracentesis, Care After This sheet gives you information about how to care for yourself after your procedure. Your health care provider may also give you more specific instructions. If you have problems or questions, contact your health care provider. What can I expect after the procedure? After your procedure, it is common to have some pain at the site where the needle was inserted (puncture site). Follow these instructions at home:  Care of the puncture site  Follow instructions from your health care provider about how to take care of your puncture site. Make sure you: ? Wash your hands with soap and water before you change your bandage (dressing). If soap and water are not available, use hand sanitizer. ? Change your dressing as told by your health care provider.  Check the puncture site every day for signs of infection. Check for: ? Redness, swelling, or pain. ? Fluid or blood. ? Warmth. ? Pus or a bad smell.  Do not take baths, swim, or use a hot tub until your health care provider approves. General instructions  Take over-the-counter and prescription medicines only as told by your health care provider.  Do not drive for 24 hours if you were given a medicine to help you relax (sedative) during your procedure.  Drink enough fluid to keep your urine pale yellow.  You may return to your normal diet and normal activities as told by your health care provider.  Keep all follow-up visits as told by your health care provider. This is important. Contact a health care provider if you:  Have redness, swelling, or pain at your puncture site.  Have fluid or blood coming from your puncture site.  Notice that your puncture site feels warm to the touch.  Have pus or a bad smell coming from your puncture site.  Have a fever.  Have chills.  Have nausea or vomiting.  Have trouble breathing.  Develop a worsening cough. Get help right away if you:  Have extreme shortness of  breath.  Develop chest pain.  Faint or feel light-headed. Summary  After your procedure, it is common to have some pain at the site where the needle was inserted (puncture site).  Wash your hands with soap and water before you change your bandage (dressing).  Check your puncture site every day for signs of infection.  Take over-the-counter and prescription medicines only as told by your health care provider. This information is not intended to replace advice given to you by your health care provider. Make sure you discuss any questions you have with your health care provider. Document Released: 01/02/2015 Document Revised: 11/24/2017 Document Reviewed: 11/06/2017 Elsevier Patient Education  2020 [employer].

## 2019-07-25 NOTE — Progress Notes (Signed)
Discharged home via wheelchair, wife driving.  Reviewed Xray with Dr. Ashby Dawes prior to discharge.

## 2019-07-25 NOTE — Procedures (Signed)
PROCEDURE NOTE: THORACENTESIS  Findings: - Highly loculated right-sided pleural effusion. - Approximately 700 cc of bloody fluid was removed from the right pleural space.  Date: 07/25/2019,  MRN# [id number] Md [place].   Indication: Pleural effusion/diagnsyic/therapeutic/ Empyema  A time-out was completed verifying correct patient, procedure, site, positioning, and implant(s) or special equipment if applicable.  Ultrasound guidance was used and appropriate fluid pocket was identified and marked.   Patient was positioned, prepped and draped in usual sterile fashion. 1% Lidocaine was used to anesthetize the area.   The thoracentesis kit was used to advance the thoracentesis tube over the trocar into the pleural space, aspiration was placed upon the syringe until red fluid was seen in the syringe.  The trocar was then removed as the catheter was advanced into the space.  This was hooked up to the negative pressure bottle using the connector.  Fluid was then drained of the pleural space up to about 750 cc until drainage stops spontaneously.  Patient tolerated the procedure well.  A chest xray was ordered.  Total Fluid Removed: 750 cc Color of Fluid: Red.   Patient tolerated the procedure well and there were no complications.  Marda Stalker, M.D., F.C.C.P.  Board Certified in Internal Medicine, Pulmonary Medicine, [place], and Sleep Medicine.  [facility] Office Number: [phone]

## 2019-07-26 LAB — ACID FAST SMEAR (AFB, MYCOBACTERIA): Acid Fast Smear: NEGATIVE

## 2019-07-26 LAB — PROTEIN, BODY FLUID (OTHER): Total Protein, Body Fluid Other: 2.9 g/dL

## 2019-07-28 LAB — BODY FLUID CULTURE

## 2019-07-29 LAB — CHOLESTEROL, BODY FLUID: Cholesterol, Fluid: 32 mg/dL

## 2019-07-30 LAB — CYTOLOGY - NON PAP

## 2019-08-02 ENCOUNTER — Inpatient Hospital Stay: Payer: Medicare Other | Admitting: Cardiothoracic Surgery

## 2019-08-05 ENCOUNTER — Telehealth: Payer: Self-pay | Admitting: Internal Medicine

## 2019-08-05 ENCOUNTER — Other Ambulatory Visit: Payer: Self-pay | Admitting: Internal Medicine

## 2019-08-05 MED ORDER — AMOXICILLIN-POT CLAVULANATE 875-125 MG PO TABS: 1.0000 | ORAL_TABLET | Freq: Two times a day (BID) | ORAL | 0 refills | Status: AC

## 2019-08-05 NOTE — Telephone Encounter (Signed)
Called and spoke to [name] with [facility], and made him aware that Rx for abx can be canceled per DR verbally.  Nothing further is needed.

## 2019-08-13 LAB — FUNGUS CULTURE RESULT

## 2019-08-13 LAB — FUNGUS CULTURE WITH STAIN

## 2019-08-13 LAB — FUNGAL ORGANISM REFLEX

## 2019-08-14 ENCOUNTER — Telehealth: Payer: Self-pay | Admitting: Primary Care

## 2019-08-14 NOTE — Telephone Encounter (Signed)
Spoke with wife Hassan Rowan and explained that I was calling to schedule Palliative Consult, wife stated that patient is in the [facility] and has been for 2 weeks.  She stated that he will probably be discharging to Hospice services afterwards.  She wanted [place] to cancel the referral and she requested that I also let Dr. Ashby Dawes know.  Will notify MD office.

## 2019-08-25 LAB — ACID FAST CULTURE WITH REFLEXED SENSITIVITIES (MYCOBACTERIA): Acid Fast Culture: NEGATIVE

## 2019-08-26 LAB — FUNGAL ORGANISM REFLEX

## 2019-08-26 LAB — FUNGUS CULTURE WITH STAIN

## 2019-08-26 LAB — FUNGUS CULTURE RESULT

## 2019-09-08 LAB — ACID FAST CULTURE WITH REFLEXED SENSITIVITIES (MYCOBACTERIA): Acid Fast Culture: NEGATIVE

## 2020-03-08 IMAGING — CR CHEST - 2 VIEW
2 series · 2 of 2 positions shown · non-contrast
Comparison: 07/10/2019 CT and chest x-ray

CLINICAL DATA: Right lung thoracentesis on 07/10/19. Pt is a former
smoker. Pt has hx of multiple myeloma.

EXAM:
CHEST - 2 VIEW

[chest pa]
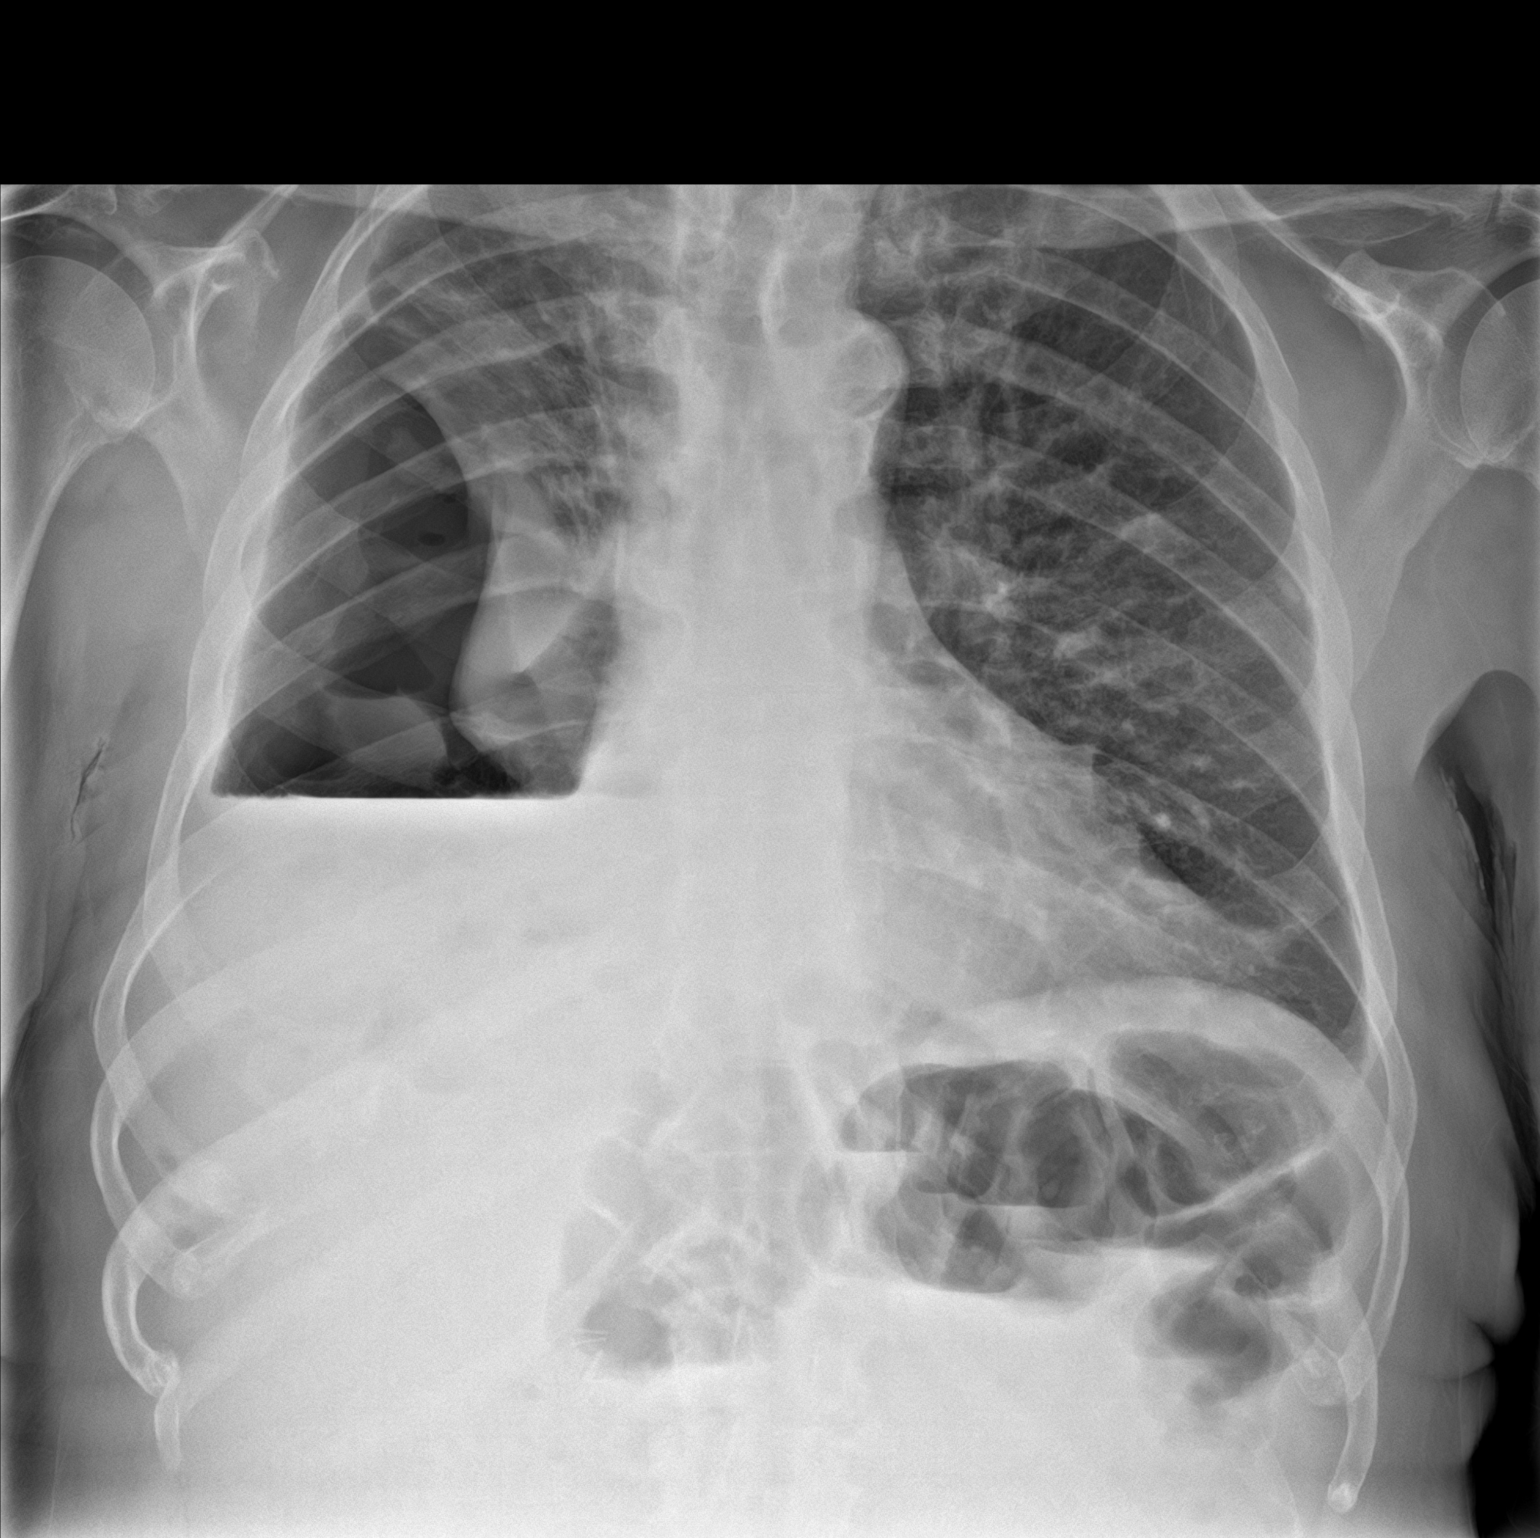

[chest lat]
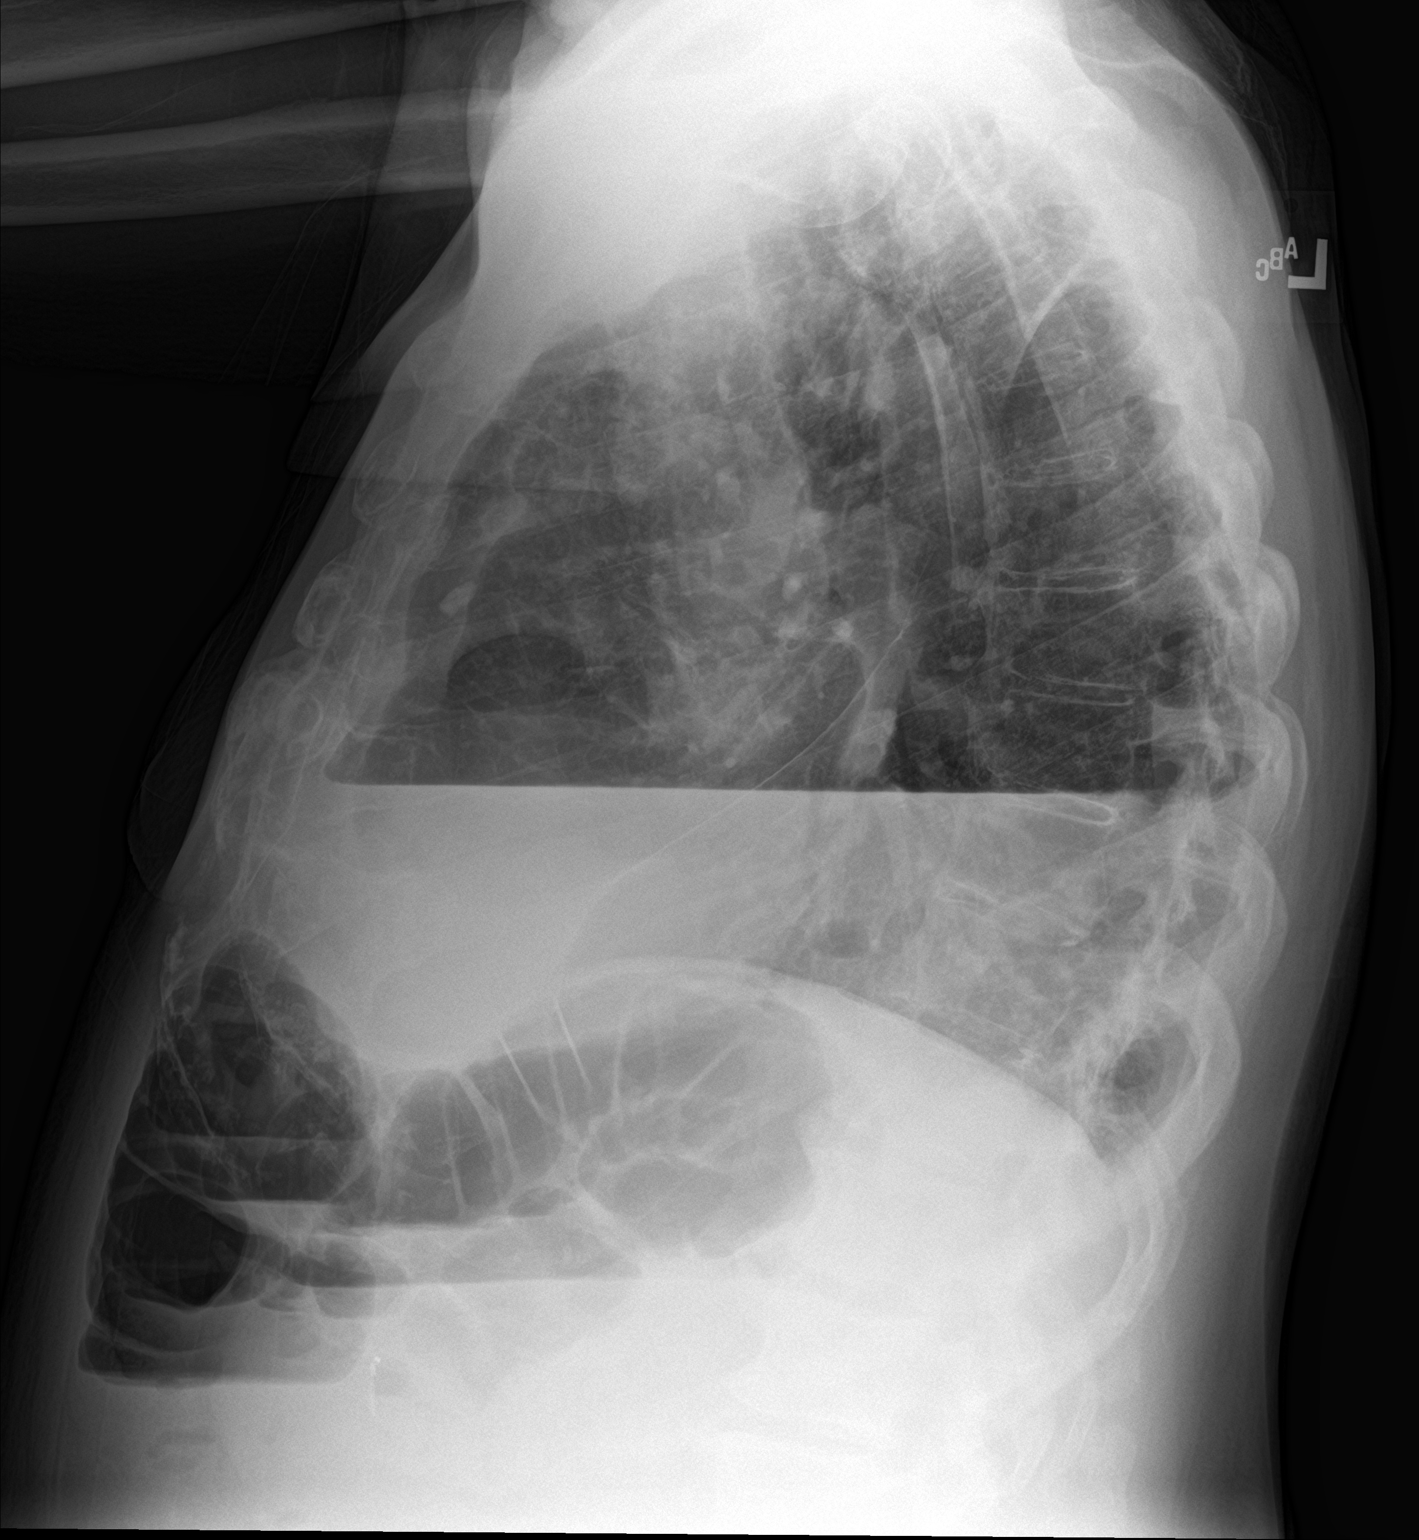

[2 of 2 positions shown; findings below may reference images not displayed]

FINDINGS: Heart size is normal. There is atherosclerotic calcification of the
thoracic aorta.

Interval development of large moderate pleural effusion within the
RIGHT pleural space. Previous pneumothorax was seen in [REDACTED]
following thoracentesis. No evidence for mediastinal shift. Dense
opacification identified in the RIGHT hilar region and RIGHT UPPER
lobe. Stable prominent interstitial markings in the LEFT lung.
IMPRESSION: Interval development of moderate RIGHT hydropneumothorax.

These results were called by telephone at the time of interpretation
on [DATE] at 07/22/2019 to Dr. Lars Egil, who verbally
acknowledged these results.

## 2020-04-25 DEATH — deceased
